# Patient Record
Sex: Male | Born: 1970
Health system: Southern US, Community
[De-identification: ages and names within clinical notes are randomized; demographics above are authoritative.]

## PROBLEM LIST (undated history)

## (undated) DIAGNOSIS — G4733 Obstructive sleep apnea (adult) (pediatric): Secondary | ICD-10-CM

## (undated) DIAGNOSIS — I2699 Other pulmonary embolism without acute cor pulmonale: Secondary | ICD-10-CM

## (undated) DIAGNOSIS — R7989 Other specified abnormal findings of blood chemistry: Secondary | ICD-10-CM

## (undated) HISTORY — DX: Obstructive sleep apnea (adult) (pediatric): G47.33

## (undated) HISTORY — DX: Other pulmonary embolism without acute cor pulmonale: I26.99

## (undated) HISTORY — DX: Other specified abnormal findings of blood chemistry: R79.89

---

## 1999-03-19 ENCOUNTER — Encounter: Payer: Self-pay | Admitting: Specialist

## 1999-03-19 ENCOUNTER — Encounter: Admission: RE | Admit: 1999-03-19 | Discharge: 1999-03-19 | Payer: Self-pay | Admitting: Specialist

## 1999-03-27 ENCOUNTER — Encounter: Admission: RE | Admit: 1999-03-27 | Discharge: 1999-04-03 | Payer: Self-pay | Admitting: Specialist

## 2003-08-22 ENCOUNTER — Ambulatory Visit (HOSPITAL_COMMUNITY): Admission: RE | Admit: 2003-08-22 | Discharge: 2003-08-22 | Payer: Self-pay | Admitting: Unknown Physician Specialty

## 2003-12-18 ENCOUNTER — Ambulatory Visit: Payer: Self-pay | Admitting: Family Medicine

## 2006-03-30 ENCOUNTER — Ambulatory Visit: Payer: Self-pay | Admitting: Family Medicine

## 2012-04-08 ENCOUNTER — Ambulatory Visit (INDEPENDENT_AMBULATORY_CARE_PROVIDER_SITE_OTHER): Payer: BC Managed Care – PPO | Admitting: Nurse Practitioner

## 2012-04-08 ENCOUNTER — Encounter: Payer: Self-pay | Admitting: Nurse Practitioner

## 2012-04-08 VITALS — BP 113/75 | HR 67 | Temp 97.2°F | Ht 72.0 in | Wt 244.0 lb

## 2012-04-08 DIAGNOSIS — Z0000 Encounter for general adult medical examination without abnormal findings: Secondary | ICD-10-CM

## 2012-04-08 LAB — COMPLETE METABOLIC PANEL WITH GFR
ALT: 18 U/L (ref 0–53)
AST: 15 U/L (ref 0–37)
Albumin: 4.7 g/dL (ref 3.5–5.2)
Alkaline Phosphatase: 73 U/L (ref 39–117)
BUN: 14 mg/dL (ref 6–23)
CO2: 28 mEq/L (ref 19–32)
Calcium: 9.9 mg/dL (ref 8.4–10.5)
Chloride: 103 mEq/L (ref 96–112)
Creat: 1.28 mg/dL (ref 0.50–1.35)
GFR, Est African American: 79 mL/min
GFR, Est Non African American: 69 mL/min
Glucose, Bld: 87 mg/dL (ref 70–99)
Potassium: 4.8 mEq/L (ref 3.5–5.3)
Sodium: 139 mEq/L (ref 135–145)
Total Bilirubin: 0.9 mg/dL (ref 0.3–1.2)
Total Protein: 7.2 g/dL (ref 6.0–8.3)

## 2012-04-08 LAB — POCT CBC
Granulocyte percent: 61.3 %G (ref 37–80)
HCT, POC: 45.53 % (ref 43.5–53.7)
Hemoglobin: 15.3 g/dL (ref 14.1–18.1)
Lymph, poc: 1.9 (ref 0.6–3.4)
MCH, POC: 29.8 pg (ref 27–31.2)
MCHC: 33.6 g/dL (ref 31.8–35.4)
MCV: 88.6 fL (ref 80–97)
MPV: 8.5 fL (ref 0–99.8)
POC Granulocyte: 3.6 (ref 2–6.9)
POC LYMPH PERCENT: 32 %L (ref 10–50)
Platelet Count, POC: 209 10*3/uL (ref 142–424)
RBC: 5.1 M/uL (ref 4.69–6.13)
RDW, POC: 13.5 %
WBC: 5.9 10*3/uL (ref 4.6–10.2)

## 2012-04-08 NOTE — Progress Notes (Signed)
  Subjective:    Patient ID: Shawn [name], male    DOB: [birth year], 42 y.o.   MRN: [id number]  HPI Pt here for complete physical with no complaints at this time    Review of Systems  All other systems reviewed and are negative.       Objective:   Physical Exam  Constitutional: He is oriented to person, place, and time. He appears well-developed and well-nourished.  HENT:  Head: Normocephalic.  Right Ear: External ear normal.  Left Ear: External ear normal.  Nose: Nose normal.  Mouth/Throat: Oropharynx is clear and moist.  Eyes: EOM are normal. Pupils are equal, round, and reactive to light.  Neck: Normal range of motion. Neck supple. No thyromegaly present.  Cardiovascular: Normal rate, regular rhythm, normal heart sounds and intact distal pulses.   No murmur heard. Pulmonary/Chest: Effort normal and breath sounds normal. He has no wheezes. He has no rales.  Abdominal: Soft. Bowel sounds are normal.  Genitourinary: Prostate normal and penis normal.  Musculoskeletal: Normal range of motion.  Neurological: He is alert and oriented to person, place, and time.  Skin: Skin is warm and dry.  Psychiatric: He has a normal mood and affect. His behavior is normal. Judgment and thought content normal.   BP 113/75  Pulse 67  Temp(Src) 97.2 F (36.2 C) (Oral)  Ht 6' (1.829 m)  Wt 244 lb (110.678 kg)  BMI 33.09 kg/m2        Assessment & Plan:  CPE  Labs pending  Low fat diet and exercise  Health Maintenance  [name], FNP

## 2012-04-08 NOTE — Patient Instructions (Signed)
Health Maintenance, Males A healthy lifestyle and preventative care can promote health and wellness.  Maintain regular health, dental, and eye exams.  Eat a healthy diet. Foods like vegetables, fruits, whole grains, low-fat dairy products, and lean protein foods contain the nutrients you need without too many calories. Decrease your intake of foods high in solid fats, added sugars, and salt. Get information about a proper diet from your caregiver, if necessary.  Regular physical exercise is one of the most important things you can do for your health. Most adults should get at least 150 minutes of moderate-intensity exercise (any activity that increases your heart rate and causes you to sweat) each week. In addition, most adults need muscle-strengthening exercises on 2 or more days a week.   Maintain a healthy weight. The body mass index (BMI) is a screening tool to identify possible weight problems. It provides an estimate of body fat based on height and weight. Your caregiver can help determine your BMI, and can help you achieve or maintain a healthy weight. For adults 20 years and older:  A BMI below 18.5 is considered underweight.  A BMI of 18.5 to 24.9 is normal.  A BMI of 25 to 29.9 is considered overweight.  A BMI of 30 and above is considered obese.  Maintain normal blood lipids and cholesterol by exercising and minimizing your intake of saturated fat. Eat a balanced diet with plenty of fruits and vegetables. Blood tests for lipids and cholesterol should begin at age 20 and be repeated every 5 years. If your lipid or cholesterol levels are high, you are over 50, or you are a high risk for heart disease, you may need your cholesterol levels checked more frequently.Ongoing high lipid and cholesterol levels should be treated with medicines, if diet and exercise are not effective.  If you smoke, find out from your caregiver how to quit. If you do not use tobacco, do not start.  If you  choose to drink alcohol, do not exceed 2 drinks per day. One drink is considered to be 12 ounces (355 mL) of beer, 5 ounces (148 mL) of wine, or 1.5 ounces (44 mL) of liquor.  Avoid use of street drugs. Do not share needles with anyone. Ask for help if you need support or instructions about stopping the use of drugs.  High blood pressure causes heart disease and increases the risk of stroke. Blood pressure should be checked at least every 1 to 2 years. Ongoing high blood pressure should be treated with medicines if weight loss and exercise are not effective.  If you are 45 to 42 years old, ask your caregiver if you should take aspirin to prevent heart disease.  Diabetes screening involves taking a blood sample to check your fasting blood sugar level. This should be done once every 3 years, after age 45, if you are within normal weight and without risk factors for diabetes. Testing should be considered at a younger age or be carried out more frequently if you are overweight and have at least 1 risk factor for diabetes.  Colorectal cancer can be detected and often prevented. Most routine colorectal cancer screening begins at the age of 50 and continues through age 75. However, your caregiver may recommend screening at an earlier age if you have risk factors for colon cancer. On a yearly basis, your caregiver may provide home test kits to check for hidden blood in the stool. Use of a small camera at the end of a tube,   to directly examine the colon (sigmoidoscopy or colonoscopy), can detect the earliest forms of colorectal cancer. Talk to your caregiver about this at age 50, when routine screening begins. Direct examination of the colon should be repeated every 5 to 10 years through age 75, unless early forms of pre-cancerous polyps or small growths are found.  Hepatitis C blood testing is recommended for all people born from 1945 through 1965 and any individual with known risks for hepatitis C.  Healthy  men should no longer receive prostate-specific antigen (PSA) blood tests as part of routine cancer screening. Consult with your caregiver about prostate cancer screening.  Testicular cancer screening is not recommended for adolescents or adult males who have no symptoms. Screening includes self-exam, caregiver exam, and other screening tests. Consult with your caregiver about any symptoms you have or any concerns you have about testicular cancer.  Practice safe sex. Use condoms and avoid high-risk sexual practices to reduce the spread of sexually transmitted infections (STIs).  Use sunscreen with a sun protection factor (SPF) of 30 or greater. Apply sunscreen liberally and repeatedly throughout the day. You should seek shade when your shadow is shorter than you. Protect yourself by wearing long sleeves, pants, a wide-brimmed hat, and sunglasses year round, whenever you are outdoors.  Notify your caregiver of new moles or changes in moles, especially if there is a change in shape or color. Also notify your caregiver if a mole is larger than the size of a pencil eraser.  A one-time screening for abdominal aortic aneurysm (AAA) and surgical repair of large AAAs by sound wave imaging (ultrasonography) is recommended for ages 65 to 75 years who are current or former smokers.  Stay current with your immunizations. Document Released: 06/20/2007 Document Revised: 03/16/2011 Document Reviewed: 05/19/2010 [employer] Patient Information 2013 [employer].  

## 2012-04-09 LAB — PSA: PSA: 0.94 ng/mL (ref <=4.00)

## 2012-04-11 LAB — NMR LIPOPROFILE WITH LIPIDS
Cholesterol, Total: 202 mg/dL — ABNORMAL HIGH (ref <200)
HDL Particle Number: 34.1 umol/L (ref >=30.5)
HDL Size: 8.8 nm — ABNORMAL LOW (ref >=9.2)
HDL-C: 53 mg/dL (ref >=40)
LDL (calc): 136 mg/dL — ABNORMAL HIGH (ref <100)
LDL Particle Number: 1400 nmol/L — ABNORMAL HIGH (ref <1000)
LDL Size: 21.1 nm (ref >20.5)
LP-IR Score: 31 (ref <=45)
Large HDL-P: 4.5 umol/L — ABNORMAL LOW (ref >=4.8)
Large VLDL-P: 1 nmol/L (ref <=2.7)
Small LDL Particle Number: 623 nmol/L — ABNORMAL HIGH (ref <=527)
Triglycerides: 64 mg/dL (ref <150)
VLDL Size: 38 nm (ref <=46.6)

## 2013-03-06 ENCOUNTER — Telehealth: Payer: Self-pay | Admitting: Nurse Practitioner

## 2013-03-06 NOTE — Telephone Encounter (Signed)
appt tomorrow with Bill 

## 2013-03-07 ENCOUNTER — Ambulatory Visit (INDEPENDENT_AMBULATORY_CARE_PROVIDER_SITE_OTHER): Payer: BC Managed Care – PPO

## 2013-03-07 ENCOUNTER — Encounter: Payer: Self-pay | Admitting: Family Medicine

## 2013-03-07 ENCOUNTER — Ambulatory Visit (INDEPENDENT_AMBULATORY_CARE_PROVIDER_SITE_OTHER): Payer: BC Managed Care – PPO | Admitting: Family Medicine

## 2013-03-07 VITALS — BP 121/77 | HR 91 | Temp 97.5°F | Ht 72.0 in | Wt 246.6 lb

## 2013-03-07 DIAGNOSIS — R0602 Shortness of breath: Secondary | ICD-10-CM

## 2013-03-07 DIAGNOSIS — E785 Hyperlipidemia, unspecified: Secondary | ICD-10-CM

## 2013-03-07 MED ORDER — LEVOFLOXACIN 500 MG PO TABS: 500.0000 mg | ORAL_TABLET | Freq: Every day | ORAL | Status: DC

## 2013-03-07 MED ORDER — METHYLPREDNISOLONE (PAK) 4 MG PO TABS: ORAL_TABLET | ORAL | Status: DC

## 2013-03-07 MED ORDER — HYDROCODONE-HOMATROPINE 5-1.5 MG/5ML PO SYRP: 5.0000 mL | ORAL_SOLUTION | Freq: Three times a day (TID) | ORAL | Status: DC | PRN

## 2013-03-07 NOTE — Progress Notes (Signed)
   Subjective:    Patient ID: Shawn [name], male    DOB: 02/25/1970, 43 y.o.   MRN: [id number]  HPI  This 43 y.o. male presents for evaluation of cough and bronchitis sx's.  Review of Systems No chest pain, SOB, HA, dizziness, vision change, N/V, diarrhea, constipation, dysuria, urinary urgency or frequency, myalgias, arthralgias or rash.    Objective:   Physical Exam   Vital signs noted  Well developed well nourished male.  HEENT - Head atraumatic Normocephalic                Eyes - PERRLA, Conjuctiva - clear Sclera- Clear EOMI                Ears - EAC's Wnl TM's Wnl Gross Hearing WNL                Nose - Nares patent                 Throat - oropharanx wnl Respiratory - Lungs CTA bilateral Cardiac - RRR S1 and S2 without murmur GI - Abdomen soft Nontender and bowel sounds active x 4 Extremities - No edema. Neuro - Grossly intact.    CXR - No infiltrates Prelimnary reading by Angeline SlimWilliam Herberto Ledwell,FNP Assessment & Plan:  SOB (shortness of breath) - Plan: DG Chest 2 View  Bronchitis - Levaquin 500mg  one po bid x 10 days, medrol dose pack as directed, Hycodan cough syrup.  Push po fluids, rest, tylenol and motrin otc prn as directed for fever, arthralgias, and myalgias.  Follow up prn if sx's continue or persist.  Deatra CanterWilliam J Mry Lamia FNP

## 2013-03-08 LAB — LIPID PANEL
Chol/HDL Ratio: 4 ratio units (ref 0.0–5.0)
Cholesterol, Total: 158 mg/dL (ref 100–199)
HDL: 40 mg/dL (ref >39)
LDL Calculated: 99 mg/dL (ref 0–99)
Triglycerides: 96 mg/dL (ref 0–149)
VLDL Cholesterol Cal: 19 mg/dL (ref 5–40)

## 2013-06-07 ENCOUNTER — Ambulatory Visit (INDEPENDENT_AMBULATORY_CARE_PROVIDER_SITE_OTHER): Payer: BC Managed Care – PPO | Admitting: Family Medicine

## 2013-06-07 ENCOUNTER — Encounter: Payer: Self-pay | Admitting: Family Medicine

## 2013-06-07 VITALS — BP 121/83 | HR 62 | Temp 98.2°F | Ht 72.0 in | Wt 245.0 lb

## 2013-06-07 DIAGNOSIS — R1012 Left upper quadrant pain: Secondary | ICD-10-CM

## 2013-06-07 DIAGNOSIS — M549 Dorsalgia, unspecified: Secondary | ICD-10-CM

## 2013-06-07 DIAGNOSIS — K5900 Constipation, unspecified: Secondary | ICD-10-CM

## 2013-06-07 DIAGNOSIS — R3915 Urgency of urination: Secondary | ICD-10-CM

## 2013-06-07 LAB — POCT URINALYSIS DIPSTICK
Bilirubin, UA: NEGATIVE
Blood, UA: NEGATIVE
Glucose, UA: NEGATIVE
KETONES UA: NEGATIVE
Leukocytes, UA: NEGATIVE
Nitrite, UA: NEGATIVE
PH UA: 5
SPEC GRAV UA: 1.015
UROBILINOGEN UA: NEGATIVE

## 2013-06-07 LAB — POCT CBC
GRANULOCYTE PERCENT: 72.7 % (ref 37–80)
HEMATOCRIT: 49.5 % (ref 43.5–53.7)
Hemoglobin: 16 g/dL (ref 14.1–18.1)
Lymph, poc: 1.6 (ref 0.6–3.4)
MCH, POC: 29.1 pg (ref 27–31.2)
MCHC: 32.3 g/dL (ref 31.8–35.4)
MCV: 90.2 fL (ref 80–97)
MPV: 8.6 fL (ref 0–99.8)
POC GRANULOCYTE: 4.9 (ref 2–6.9)
POC LYMPH PERCENT: 23.3 %L (ref 10–50)
Platelet Count, POC: 189 10*3/uL (ref 142–424)
RBC: 5.5 M/uL (ref 4.69–6.13)
RDW, POC: 13.2 %
WBC: 6.7 10*3/uL (ref 4.6–10.2)

## 2013-06-07 LAB — POCT UA - MICROSCOPIC ONLY
BACTERIA, U MICROSCOPIC: NEGATIVE
CRYSTALS, UR, HPF, POC: NEGATIVE
Casts, Ur, LPF, POC: NEGATIVE
Epithelial cells, urine per micros: NEGATIVE
MUCUS UA: NEGATIVE
RBC, urine, microscopic: NEGATIVE
WBC, Ur, HPF, POC: NEGATIVE
Yeast, UA: NEGATIVE

## 2013-06-07 MED ORDER — LINACLOTIDE 145 MCG PO CAPS: ORAL_CAPSULE | ORAL | Status: DC

## 2013-06-07 NOTE — Patient Instructions (Addendum)
Constipation, Adult Constipation is when a person has fewer than 3 bowel movements a week; has difficulty having a bowel movement; or has stools that are dry, hard, or larger than normal. As people grow older, constipation is more common. If you try to fix constipation with medicines that make you have a bowel movement (laxatives), the problem may get worse. Long-term laxative use may cause the muscles of the colon to become weak. A low-fiber diet, not taking in enough fluids, and taking certain medicines may make constipation worse. CAUSES   Certain medicines, such as antidepressants, pain medicine, iron supplements, antacids, and water pills.   Certain diseases, such as diabetes, irritable bowel syndrome (IBS), thyroid disease, or depression.   Not drinking enough water.   Not eating enough fiber-rich foods.   Stress or travel.  Lack of physical activity or exercise.  Not going to the restroom when there is the urge to have a bowel movement.  Ignoring the urge to have a bowel movement.  Using laxatives too much. SYMPTOMS   Having fewer than 3 bowel movements a week.   Straining to have a bowel movement.   Having hard, dry, or larger than normal stools.   Feeling full or bloated.   Pain in the lower abdomen.  Not feeling relief after having a bowel movement. DIAGNOSIS  Your caregiver will take a medical history and perform a physical exam. Further testing may be done for severe constipation. Some tests may include:   A barium enema X-ray to examine your rectum, colon, and sometimes, your small intestine.  A sigmoidoscopy to examine your lower colon.  A colonoscopy to examine your entire colon. TREATMENT  Treatment will depend on the severity of your constipation and what is causing it. Some dietary treatments include drinking more fluids and eating more fiber-rich foods. Lifestyle treatments may include regular exercise. If these diet and lifestyle  recommendations do not help, your caregiver may recommend taking over-the-counter laxative medicines to help you have bowel movements. Prescription medicines may be prescribed if over-the-counter medicines do not work.  HOME CARE INSTRUCTIONS   Increase dietary fiber in your diet, such as fruits, vegetables, whole grains, and beans. Limit high-fat and processed sugars in your diet, such as [place] fries, hamburgers, cookies, candies, and soda.   A fiber supplement may be added to your diet if you cannot get enough fiber from foods.   Drink enough fluids to keep your urine clear or pale yellow.   Exercise regularly or as directed by your caregiver.   Go to the restroom when you have the urge to go. Do not hold it.  Only take medicines as directed by your caregiver. Do not take other medicines for constipation without talking to your caregiver first. SEEK IMMEDIATE MEDICAL CARE IF:   You have bright red blood in your stool.   Your constipation lasts for more than 4 days or gets worse.   You have abdominal or rectal pain.   You have thin, pencil-like stools.  You have unexplained weight loss. MAKE SURE YOU:   Understand these instructions.  Will watch your condition.  Will get help right away if you are not doing well or get worse. Document Released: 09/20/2003 Document Revised: 03/16/2011 Document Reviewed: 10/03/2012 [facility] Patient Information 2014 [place], [place].   To get a CT scan of the abdomen and pelvis  take prescribed medication as directed Continue to drink plenty of water and fluids Return the FOBT We will  call you with the lab work as soon as the results are available He may continue the stool softener you're currently taking in addition to the medication we prescribed for constipation

## 2013-06-07 NOTE — Addendum Note (Signed)
Addended by: Magdalene River on: 06/07/2013 03:19 PM   Modules accepted: Orders

## 2013-06-07 NOTE — Progress Notes (Signed)
Subjective:    Patient ID: Shawn [name], male    DOB: 12-Dec-1970, 43 y.o.   MRN: [id number]  HPI Patient here today for constipation issues. The patient says this has been going on for years. His constipation is worse he has increased back pain. For about a year he has had increased urinary urgency. His last good bowel movement was one week ago. He is not taking any medication and indicates he drinks lots of water and fluids and eats what he thinks is a very healthy diet with fruits and vegetables. He is taking some stool softener and had a slight small bowel movement this morning. The patient indicates that his father has had the very same problem. There is no family history of colon cancer. The patient has not seen any blood or bright in his stool.        There are no active problems to display for this patient.  Outpatient Encounter Prescriptions as of 06/07/2013  Medication Sig  . [DISCONTINUED] HYDROcodone-homatropine (HYCODAN) 5-1.5 MG/5ML syrup Take 5 mLs by mouth every 8 (eight) hours as needed for cough.  . [DISCONTINUED] levofloxacin (LEVAQUIN) 500 MG tablet Take 1 tablet (500 mg total) by mouth daily.  . [DISCONTINUED] methylPREDNIsolone (MEDROL DOSPACK) 4 MG tablet follow package directions    Review of Systems  Constitutional: Negative.   HENT: Negative.   Eyes: Negative.   Respiratory: Negative.   Cardiovascular: Negative.   Gastrointestinal: Positive for constipation.  Endocrine: Negative.   Genitourinary: Negative.   Musculoskeletal: Positive for back pain.  Skin: Negative.   Allergic/Immunologic: Negative.   Neurological: Negative.   Hematological: Negative.   Psychiatric/Behavioral: Negative.        Objective:   Physical Exam  Nursing note and vitals reviewed. Constitutional: He is oriented to person, place, and time. He appears well-developed and well-nourished. No distress.  HENT:  Head: Normocephalic and atraumatic.  Right Ear: External ear normal.    Left Ear: External ear normal.  Mouth/Throat: Oropharynx is clear and moist. No oropharyngeal exudate.  Nasal congestion  Eyes: Conjunctivae and EOM are normal. Pupils are equal, round, and reactive to light. Right eye exhibits no discharge. Left eye exhibits no discharge. No scleral icterus.  Neck: Normal range of motion. Neck supple. No thyromegaly present.  Cardiovascular: Normal rate, regular rhythm, normal heart sounds and intact distal pulses.  Exam reveals no gallop and no friction rub.   No murmur heard. Pulmonary/Chest: Effort normal and breath sounds normal. No respiratory distress. He has no wheezes. He has no rales. He exhibits no tenderness.  Abdominal: Soft. Bowel sounds are normal. He exhibits no mass. There is tenderness. There is no rebound and no guarding.  Left Upper quadrant and left side of abdomen  Genitourinary: Rectum normal and penis normal.  The prostate was enlarged. There were no rectal masses. There was no stool palpable in the rectum. The external genitalia were normal  Musculoskeletal: Normal range of motion. He exhibits no edema and no tenderness.  Lymphadenopathy:    He has no cervical adenopathy.  Neurological: He is alert and oriented to person, place, and time. He has normal reflexes.  Skin: Skin is warm and dry. No rash noted. No erythema. No pallor.  Psychiatric: He has a normal mood and affect. His behavior is normal. Judgment and thought content normal.   BP 121/83  Pulse 62  Temp(Src) 98.2 F (36.8 C) (Oral)  Ht 6' (1.829 m)  Wt 245 lb (111.131 kg)  BMI 33.22 kg/m2  Assessment & Plan:  1. Urinary urgency - POCT UA - Microscopic Only - POCT urinalysis dipstick - POCT CBC - BMP8+EGFR - Hepatic function panel - Thyroid Panel With TSH  2. Constipation - POCT CBC - BMP8+EGFR - Hepatic function panel - Thyroid Panel With TSH - Linaclotide (LINZESS) 145 MCG CAPS capsule; 1 capsule daily one half hour before the first meal in the  morning  Dispense: 30 capsule; Refill: 1  3. Abdominal pain, left upper quadrant - Sedimentation rate - Linaclotide (LINZESS) 145 MCG CAPS capsule; 1 capsule daily one half hour before the first meal in the morning  Dispense: 30 capsule; Refill: 1 - CT Abdomen Pelvis Wo Contrast; Future  4. Back pain - Sedimentation rate - CT Abdomen Pelvis Wo Contrast; Future  Patient Instructions        Constipation, Adult Constipation is when a person has fewer than 3 bowel movements a week; has difficulty having a bowel movement; or has stools that are dry, hard, or larger than normal. As people grow older, constipation is more common. If you try to fix constipation with medicines that make you have a bowel movement (laxatives), the problem may get worse. Long-term laxative use may cause the muscles of the colon to become weak. A low-fiber diet, not taking in enough fluids, and taking certain medicines may make constipation worse. CAUSES   Certain medicines, such as antidepressants, pain medicine, iron supplements, antacids, and water pills.   Certain diseases, such as diabetes, irritable bowel syndrome (IBS), thyroid disease, or depression.   Not drinking enough water.   Not eating enough fiber-rich foods.   Stress or travel.  Lack of physical activity or exercise.  Not going to the restroom when there is the urge to have a bowel movement.  Ignoring the urge to have a bowel movement.  Using laxatives too much. SYMPTOMS   Having fewer than 3 bowel movements a week.   Straining to have a bowel movement.   Having hard, dry, or larger than normal stools.   Feeling full or bloated.   Pain in the lower abdomen.  Not feeling relief after having a bowel movement. DIAGNOSIS  Your caregiver will take a medical history and perform a physical exam. Further testing may be done for severe constipation. Some tests may include:   A barium enema X-ray to examine your rectum, colon,  and sometimes, your small intestine.  A sigmoidoscopy to examine your lower colon.  A colonoscopy to examine your entire colon. TREATMENT  Treatment will depend on the severity of your constipation and what is causing it. Some dietary treatments include drinking more fluids and eating more fiber-rich foods. Lifestyle treatments may include regular exercise. If these diet and lifestyle recommendations do not help, your caregiver may recommend taking over-the-counter laxative medicines to help you have bowel movements. Prescription medicines may be prescribed if over-the-counter medicines do not work.  HOME CARE INSTRUCTIONS   Increase dietary fiber in your diet, such as fruits, vegetables, whole grains, and beans. Limit high-fat and processed sugars in your diet, such as [place] fries, hamburgers, cookies, candies, and soda.   A fiber supplement may be added to your diet if you cannot get enough fiber from foods.   Drink enough fluids to keep your urine clear or pale yellow.   Exercise regularly or as directed by your caregiver.   Go to the restroom when you have the urge to go. Do not hold it.  Only take medicines as directed by your  caregiver. Do not take other medicines for constipation without talking to your caregiver first. [place] IF:   You have bright red blood in your stool.   Your constipation lasts for more than 4 days or gets worse.   You have abdominal or rectal pain.   You have thin, pencil-like stools.  You have unexplained weight loss. MAKE SURE YOU:   Understand these instructions.  Will watch your condition.  Will get help right away if you are not doing well or get worse. Document Released: 09/20/2003 Document Revised: 03/16/2011 Document Reviewed: 10/03/2012 [facility] Patient Information 2014 [place], [place].   To get a CT scan of the abdomen and pelvis  take prescribed medication as directed Continue to drink plenty of water and  fluids Return the FOBT We will call you with the lab work as soon as the results are available    Arrie Senate MD

## 2013-06-08 LAB — THYROID PANEL WITH TSH
Free Thyroxine Index: 1.8 (ref 1.2–4.9)
T3 Uptake Ratio: 29 % (ref 24–39)
T4, Total: 6.1 ug/dL (ref 4.5–12.0)
TSH: 1.44 u[IU]/mL (ref 0.450–4.500)

## 2013-06-08 LAB — BMP8+EGFR
BUN / CREAT RATIO: 13 (ref 9–20)
BUN: 17 mg/dL (ref 6–24)
CO2: 27 mmol/L (ref 18–29)
CREATININE: 1.3 mg/dL — AB (ref 0.76–1.27)
Calcium: 9.6 mg/dL (ref 8.7–10.2)
Chloride: 103 mmol/L (ref 97–108)
GFR calc non Af Amer: 67 mL/min/{1.73_m2} (ref >59)
GFR, EST AFRICAN AMERICAN: 77 mL/min/{1.73_m2} (ref >59)
GLUCOSE: 93 mg/dL (ref 65–99)
Potassium: 5.4 mmol/L — ABNORMAL HIGH (ref 3.5–5.2)
Sodium: 142 mmol/L (ref 134–144)

## 2013-06-08 LAB — HEPATIC FUNCTION PANEL
ALT: 35 IU/L (ref 0–44)
AST: 25 IU/L (ref 0–40)
Albumin: 4.6 g/dL (ref 3.5–5.5)
Alkaline Phosphatase: 82 IU/L (ref 39–117)
BILIRUBIN TOTAL: 0.4 mg/dL (ref 0.0–1.2)
Bilirubin, Direct: 0.13 mg/dL (ref 0.00–0.40)
TOTAL PROTEIN: 6.8 g/dL (ref 6.0–8.5)

## 2013-06-08 LAB — SEDIMENTATION RATE: Sed Rate: 2 mm/hr (ref 0–15)

## 2013-06-09 ENCOUNTER — Telehealth: Payer: Self-pay

## 2013-06-09 ENCOUNTER — Encounter (HOSPITAL_COMMUNITY): Payer: Self-pay

## 2013-06-09 ENCOUNTER — Ambulatory Visit (HOSPITAL_COMMUNITY)
Admission: RE | Admit: 2013-06-09 | Discharge: 2013-06-09 | Disposition: A | Discharge status: Home / Self Care | Payer: BC Managed Care – PPO | Source: Ambulatory Visit | Attending: Family Medicine | Admitting: Family Medicine

## 2013-06-09 DIAGNOSIS — R918 Other nonspecific abnormal finding of lung field: Secondary | ICD-10-CM | POA: Insufficient documentation

## 2013-06-09 DIAGNOSIS — M47817 Spondylosis without myelopathy or radiculopathy, lumbosacral region: Secondary | ICD-10-CM | POA: Insufficient documentation

## 2013-06-09 DIAGNOSIS — R109 Unspecified abdominal pain: Secondary | ICD-10-CM | POA: Insufficient documentation

## 2013-06-09 DIAGNOSIS — K5900 Constipation, unspecified: Secondary | ICD-10-CM

## 2013-06-09 DIAGNOSIS — M549 Dorsalgia, unspecified: Secondary | ICD-10-CM | POA: Insufficient documentation

## 2013-06-09 DIAGNOSIS — R1012 Left upper quadrant pain: Secondary | ICD-10-CM

## 2013-06-09 NOTE — Telephone Encounter (Signed)
Pt aware of CT results. 

## 2013-06-09 NOTE — Telephone Encounter (Signed)
Message copied by Roselee Culver on Fri Jun 09, 2013 12:51 PM ------      Message from: Ernestina Penna      Created: Fri Jun 09, 2013 10:35 AM       As per radiology report----please let patient know that the abdomen and pelvis were normal without any kind of masses. Also let him know that he has spondylosis and his thorax and lumbar spine and this is like wearing tear or arthritic inflammation. Also let him know there was a 3 mm nodule in the left lung base and I would go along and schedule him for a repeat CT scan in one year to followup on this.      Please make sure that the patient has a followup appointment for me to evaluate his constipation issues and also make sure that he has an FOBT that he should return++++ ------

## 2013-06-10 NOTE — Telephone Encounter (Signed)
Please go ahead and make a referral to the gastroenterologist for further evaluation and continue medication that was prescribed in this office

## 2013-06-12 NOTE — Telephone Encounter (Signed)
Patient aware and wants to go some where in [place]

## 2013-06-13 ENCOUNTER — Other Ambulatory Visit: Payer: Self-pay | Admitting: *Deleted

## 2013-06-13 DIAGNOSIS — K5900 Constipation, unspecified: Secondary | ICD-10-CM

## 2013-06-13 NOTE — Telephone Encounter (Signed)
Bill put in referral

## 2013-06-13 NOTE — Telephone Encounter (Signed)
I cant make referrals. Can you please put this in?

## 2013-06-22 ENCOUNTER — Ambulatory Visit: Payer: BC Managed Care – PPO | Admitting: Family Medicine

## 2013-09-07 ENCOUNTER — Other Ambulatory Visit: Payer: Self-pay | Admitting: Family Medicine

## 2014-06-18 ENCOUNTER — Other Ambulatory Visit: Payer: Self-pay | Admitting: *Deleted

## 2014-06-18 DIAGNOSIS — R911 Solitary pulmonary nodule: Secondary | ICD-10-CM

## 2014-06-28 ENCOUNTER — Telehealth: Payer: Self-pay | Admitting: Family Medicine

## 2014-06-28 ENCOUNTER — Ambulatory Visit (HOSPITAL_COMMUNITY)
Admission: RE | Admit: 2014-06-28 | Discharge: 2014-06-28 | Disposition: A | Discharge status: Home / Self Care | Payer: BLUE CROSS/BLUE SHIELD | Source: Ambulatory Visit | Attending: Family Medicine | Admitting: Family Medicine

## 2014-06-28 DIAGNOSIS — R911 Solitary pulmonary nodule: Secondary | ICD-10-CM | POA: Diagnosis present

## 2015-02-27 ENCOUNTER — Encounter: Payer: Self-pay | Admitting: Pediatrics

## 2015-02-27 ENCOUNTER — Ambulatory Visit: Payer: Self-pay | Admitting: Pediatrics

## 2015-02-27 ENCOUNTER — Ambulatory Visit (INDEPENDENT_AMBULATORY_CARE_PROVIDER_SITE_OTHER): Payer: BLUE CROSS/BLUE SHIELD | Admitting: Pediatrics

## 2015-02-27 ENCOUNTER — Telehealth: Payer: Self-pay | Admitting: Pediatrics

## 2015-02-27 VITALS — BP 115/73 | HR 85 | Temp 97.5°F | Ht 72.0 in | Wt 256.8 lb

## 2015-02-27 DIAGNOSIS — R6889 Other general symptoms and signs: Secondary | ICD-10-CM

## 2015-02-27 DIAGNOSIS — R062 Wheezing: Secondary | ICD-10-CM

## 2015-02-27 LAB — POCT INFLUENZA A/B
Influenza A, POC: NEGATIVE
Influenza B, POC: NEGATIVE

## 2015-02-27 MED ORDER — AZITHROMYCIN 250 MG PO TABS: ORAL_TABLET | ORAL | Status: DC

## 2015-02-27 MED ORDER — ALBUTEROL SULFATE HFA 108 (90 BASE) MCG/ACT IN AERS: 2.0000 | INHALATION_SPRAY | Freq: Four times a day (QID) | RESPIRATORY_TRACT | Status: DC | PRN

## 2015-02-27 MED ORDER — SPACER/AERO CHAMBER MOUTHPIECE MISC: Status: DC

## 2015-02-27 NOTE — Telephone Encounter (Signed)
Letter written and has been given to pt's mother.

## 2015-02-27 NOTE — Progress Notes (Signed)
Subjective:    Patient ID: Shawn [name], male    DOB: Mar 06, 1970, 45 y.o.   MRN: [id number]  CC: Nasal Congestion; Fever; Generalized Body Aches; and Cough   HPI: Edie Darley is a 45 y.o. male presenting for Nasal Congestion; Fever; Generalized Body Aches; and Cough  Started getting sick 4 days ago started coughing, still coughing things up Sweats and chills at home Taking OTC meds, not sure what they are Not sure if taking anything  Cough keeping him awake at night Coughing up a lot H/o smoking Cutting back, now down to a couple cigarettes a day   Depression screen [facility] 2/9 03/07/2013  Decreased Interest 0  Down, Depressed, Hopeless 0  PHQ - 2 Score 0     Relevant past medical, surgical, family and social history reviewed and updated as indicated. Interim medical history since our last visit reviewed. Allergies and medications reviewed and updated.    ROS: Per HPI unless specifically indicated above  History  Smoking status  . Current Every Day Smoker -- 0.25 packs/day for 15 years  Smokeless tobacco  . Not on file    Past Medical History There are no active problems to display for this patient.   Current Outpatient Prescriptions  Medication Sig Dispense Refill  . albuterol (PROVENTIL HFA;VENTOLIN HFA) 108 (90 Base) MCG/ACT inhaler Inhale 2 puffs into the lungs every 6 (six) hours as needed for wheezing or shortness of breath. 1 Inhaler 0  . azithromycin (ZITHROMAX) 250 MG tablet Take 2 the first day and then one each day after. 6 tablet 0  . Spacer/Aero Chamber Mouthpiece MISC Please dispense one spacer for use with inhaler. 1 each 0   No current facility-administered medications for this visit.       Objective:    BP 115/73 mmHg  Pulse 85  Temp(Src) 97.5 F (36.4 C) (Oral)  Ht 6' (1.829 m)  Wt 256 lb 12.8 oz (116.484 kg)  BMI 34.82 kg/m2  Wt Readings from Last 3 Encounters:  02/27/15 256 lb 12.8 oz (116.484 kg)  06/07/13 245 lb (111.131 kg)    03/07/13 246 lb 9.6 oz (111.857 kg)      Gen: NAD, alert, cooperative with exam, NCAT, congested EYES: EOMI, no scleral injection or icterus ENT:  TMs pearly gray b/l, slightly splayed LR, OP without erythema LYMPH: no cervical LAD CV: NRRR, normal S1/S2, no murmur, distal pulses 2+ b/l Resp: exp wheeze heard anteriorly and posterior. Moving air fair. normal WOB, talking in complete sentences. Abd: +BS, soft, NTND.  Ext: No edema, warm Neuro: Alert and oriented     Assessment & Plan:    Quavon was seen today for nasal congestion, fever, generalized body aches and cough, flu test negative, likely due to acute URI, symptoms ongoing 4 days. Now with wheezing, increased sputum production, smoking history. No dx of COPD, has never had PFTs. Will treat with albuterol and azithro, discussed sx care for URI.   Diagnoses and all orders for this visit:  Flu-like symptoms -     POCT Influenza A/B  Wheezing -     albuterol (PROVENTIL HFA;VENTOLIN HFA) 108 (90 Base) MCG/ACT inhaler; Inhale 2 puffs into the lungs every 6 (six) hours as needed for wheezing or shortness of breath. -     Spacer/Aero Chamber Mouthpiece MISC; Please dispense one spacer for use with inhaler. -     azithromycin (ZITHROMAX) 250 MG tablet; Take 2 the first day and then one each day after.  Follow up plan: prn  Rex Kras, MD [facility] 02/27/2015, 11:39 AM

## 2015-02-27 NOTE — Patient Instructions (Signed)
Netipot with distilled water 2-3 times a day to clear out sinuses Or Normal saline nasal spray Flonase steroid nasal spray Ibuprofen  three times a day Lots of fluids  Albuterol three times a day  Take azithromycin as prescribed

## 2015-06-03 DIAGNOSIS — W010XXA Fall on same level from slipping, tripping and stumbling without subsequent striking against object, initial encounter: Secondary | ICD-10-CM | POA: Diagnosis not present

## 2015-06-03 DIAGNOSIS — S63502A Unspecified sprain of left wrist, initial encounter: Secondary | ICD-10-CM | POA: Diagnosis not present

## 2015-06-03 DIAGNOSIS — Y929 Unspecified place or not applicable: Secondary | ICD-10-CM | POA: Diagnosis not present

## 2015-06-03 DIAGNOSIS — Y939 Activity, unspecified: Secondary | ICD-10-CM | POA: Diagnosis not present

## 2015-06-03 DIAGNOSIS — M25532 Pain in left wrist: Secondary | ICD-10-CM | POA: Diagnosis not present

## 2015-06-03 DIAGNOSIS — S62502A Fracture of unspecified phalanx of left thumb, initial encounter for closed fracture: Secondary | ICD-10-CM | POA: Diagnosis not present

## 2015-06-03 DIAGNOSIS — M7989 Other specified soft tissue disorders: Secondary | ICD-10-CM | POA: Diagnosis not present

## 2015-06-03 DIAGNOSIS — Y999 Unspecified external cause status: Secondary | ICD-10-CM | POA: Diagnosis not present

## 2015-06-03 DIAGNOSIS — R2232 Localized swelling, mass and lump, left upper limb: Secondary | ICD-10-CM | POA: Diagnosis not present

## 2016-03-04 ENCOUNTER — Ambulatory Visit: Payer: BLUE CROSS/BLUE SHIELD

## 2016-03-05 ENCOUNTER — Encounter: Payer: Self-pay | Admitting: Nurse Practitioner

## 2016-03-05 ENCOUNTER — Ambulatory Visit (INDEPENDENT_AMBULATORY_CARE_PROVIDER_SITE_OTHER): Payer: BLUE CROSS/BLUE SHIELD | Admitting: Nurse Practitioner

## 2016-03-05 VITALS — BP 109/79 | HR 77 | Temp 96.8°F | Ht 72.0 in | Wt 264.0 lb

## 2016-03-05 DIAGNOSIS — J320 Chronic maxillary sinusitis: Secondary | ICD-10-CM | POA: Diagnosis not present

## 2016-03-05 MED ORDER — CHLORPHEN-PE-ACETAMINOPHEN 4-10-325 MG PO TABS: 1.0000 | ORAL_TABLET | Freq: Four times a day (QID) | ORAL | 0 refills | Status: DC | PRN

## 2016-03-05 MED ORDER — LEVOFLOXACIN 500 MG PO TABS: 500.0000 mg | ORAL_TABLET | Freq: Every day | ORAL | 0 refills | Status: AC

## 2016-03-05 NOTE — Patient Instructions (Signed)

## 2016-03-05 NOTE — Progress Notes (Addendum)
Subjective:     Shawn [name] is a 46 y.o. male who presents for evaluation of sinus pain. Symptoms include: congestion, nasal congestion, purulent rhinorrhea and sinus pressure. Onset of symptoms was 4 days ago. Symptoms have been unchanged since that time. Past history is significant for no history of pneumonia or bronchitis. Patient is a former smoker, quit 1 MONTH ago.  The following portions of the patient's history were reviewed and updated as appropriate: allergies, current medications, past family history, past medical history, past social history, past surgical history and problem list.  Review of Systems Pertinent items noted in HPI and remainder of comprehensive ROS otherwise negative.   Objective:    BP 109/79   Pulse 77   Temp (!) 96.8 F (36 C) (Oral)   Ht 6' (1.829 m)   Wt 264 lb (119.7 kg)   BMI 35.80 kg/m  General appearance: alert, cooperative and mild distress Eyes: conjunctivae/corneas clear. PERRL, EOM's intact. Fundi benign. Ears: normal TM's and external ear canals both ears Nose: clear discharge, mild congestion, turbinates red, sinus tenderness bilateral Throat: lips, mucosa, and tongue normal; teeth and gums normal Neck: no adenopathy, no carotid bruit, no JVD, supple, symmetrical, trachea midline and thyroid not enlarged, symmetric, no tenderness/mass/nodules Lungs: clear to auscultation bilaterally Heart: regular rate and rhythm, S1, S2 normal, no murmur, click, rub or gallop    Assessment:    Acute bacterial sinusitis.    Plan:     1. Take meds as prescribed 2. Use a cool mist humidifier especially during the winter months and when heat has been humid. 3. Use saline nose sprays frequently 4. Saline irrigations of the nose can be very helpful if done frequently.  * 4X daily for 1 week*  * Use of a nettie pot can be helpful with this. Follow directions with this* 5. Drink plenty of fluids 6. Keep thermostat turn down low 7.For any cough or  congestion  Use plain Mucinex- regular strength or max strength is fine   * Children- consult with Pharmacist for dosing 8. For fever or aces or pains- take tylenol or ibuprofen appropriate for age and weight.  * for fevers greater than 101 orally you may alternate ibuprofen and tylenol every  3 hours.   Meds ordered this encounter  Medications  . levofloxacin (LEVAQUIN) 500 MG tablet    Sig: Take 1 tablet (500 mg total) by mouth daily.    Dispense:  10 tablet    Refill:  0    Order Specific Question:   Supervising Provider    Answer:   [name], Shawn [name] [[id number]]  . Chlorphen-PE-Acetaminophen 4-10-325 MG TABS    Sig: Take 1 tablet by mouth every 6 (six) hours as needed.    Dispense:  20 tablet    Refill:  0    Order Specific Question:   Supervising Provider    Answer:   Shawn [name] [[id number]]    Orders Placed This Encounter  Procedures  . Ambulatory referral to ENT    Referral Priority:   Routine    Referral Type:   Consultation    Referral Reason:   Specialty Services Required    Referred to Provider:   Pincus BadderBrian Matthews, [name]    Requested Specialty:   Otolaryngology    Number of Visits Requested:   1   Shawn [name]

## 2016-03-05 NOTE — Addendum Note (Signed)
Addended by: Bennie PieriniMARTIN, MARY-MARGARET on: 03/05/2016 08:52 AM   Modules accepted: Orders

## 2016-03-12 ENCOUNTER — Telehealth: Payer: Self-pay | Admitting: Pediatrics

## 2016-03-12 NOTE — Telephone Encounter (Signed)
[facility] [place] [place] [facility]

## 2016-03-19 DIAGNOSIS — M950 Acquired deformity of nose: Secondary | ICD-10-CM | POA: Diagnosis not present

## 2016-03-19 DIAGNOSIS — J329 Chronic sinusitis, unspecified: Secondary | ICD-10-CM | POA: Diagnosis not present

## 2016-03-19 DIAGNOSIS — G4730 Sleep apnea, unspecified: Secondary | ICD-10-CM | POA: Diagnosis not present

## 2016-03-19 DIAGNOSIS — J328 Other chronic sinusitis: Secondary | ICD-10-CM | POA: Diagnosis not present

## 2016-03-19 DIAGNOSIS — J3089 Other allergic rhinitis: Secondary | ICD-10-CM | POA: Diagnosis not present

## 2016-04-02 DIAGNOSIS — J301 Allergic rhinitis due to pollen: Secondary | ICD-10-CM | POA: Diagnosis not present

## 2016-04-09 DIAGNOSIS — J329 Chronic sinusitis, unspecified: Secondary | ICD-10-CM | POA: Diagnosis not present

## 2016-04-09 DIAGNOSIS — G4733 Obstructive sleep apnea (adult) (pediatric): Secondary | ICD-10-CM | POA: Diagnosis not present

## 2016-04-16 DIAGNOSIS — G4733 Obstructive sleep apnea (adult) (pediatric): Secondary | ICD-10-CM | POA: Diagnosis not present

## 2016-04-20 DIAGNOSIS — G4730 Sleep apnea, unspecified: Secondary | ICD-10-CM | POA: Diagnosis not present

## 2016-04-20 DIAGNOSIS — J342 Deviated nasal septum: Secondary | ICD-10-CM | POA: Diagnosis not present

## 2016-05-15 DIAGNOSIS — G4730 Sleep apnea, unspecified: Secondary | ICD-10-CM | POA: Diagnosis not present

## 2016-05-15 DIAGNOSIS — J342 Deviated nasal septum: Secondary | ICD-10-CM | POA: Diagnosis not present

## 2016-05-15 DIAGNOSIS — M950 Acquired deformity of nose: Secondary | ICD-10-CM | POA: Diagnosis not present

## 2016-05-22 DIAGNOSIS — J343 Hypertrophy of nasal turbinates: Secondary | ICD-10-CM | POA: Diagnosis not present

## 2016-05-22 DIAGNOSIS — J342 Deviated nasal septum: Secondary | ICD-10-CM | POA: Diagnosis not present

## 2019-09-05 DIAGNOSIS — Z20822 Contact with and (suspected) exposure to covid-19: Secondary | ICD-10-CM | POA: Diagnosis not present

## 2019-09-15 ENCOUNTER — Encounter (HOSPITAL_COMMUNITY): Payer: Self-pay

## 2019-09-15 ENCOUNTER — Encounter: Payer: Self-pay | Admitting: Family Medicine

## 2019-09-15 ENCOUNTER — Ambulatory Visit (INDEPENDENT_AMBULATORY_CARE_PROVIDER_SITE_OTHER): Payer: BLUE CROSS/BLUE SHIELD | Admitting: Family Medicine

## 2019-09-15 ENCOUNTER — Other Ambulatory Visit: Payer: Self-pay

## 2019-09-15 ENCOUNTER — Emergency Department (HOSPITAL_COMMUNITY): Payer: BC Managed Care – PPO

## 2019-09-15 ENCOUNTER — Inpatient Hospital Stay (HOSPITAL_COMMUNITY)
Admission: EM | Admit: 2019-09-15 | Discharge: 2019-09-19 | DRG: 177 | Disposition: A | Discharge status: Home / Self Care | Payer: BC Managed Care – PPO | Source: Ambulatory Visit | Attending: Internal Medicine | Admitting: Internal Medicine

## 2019-09-15 DIAGNOSIS — J9601 Acute respiratory failure with hypoxia: Secondary | ICD-10-CM | POA: Diagnosis present

## 2019-09-15 DIAGNOSIS — Z720 Tobacco use: Secondary | ICD-10-CM | POA: Diagnosis not present

## 2019-09-15 DIAGNOSIS — Z87891 Personal history of nicotine dependence: Secondary | ICD-10-CM

## 2019-09-15 DIAGNOSIS — R05 Cough: Secondary | ICD-10-CM | POA: Diagnosis not present

## 2019-09-15 DIAGNOSIS — R0602 Shortness of breath: Secondary | ICD-10-CM | POA: Diagnosis not present

## 2019-09-15 DIAGNOSIS — Z833 Family history of diabetes mellitus: Secondary | ICD-10-CM

## 2019-09-15 DIAGNOSIS — J1282 Pneumonia due to coronavirus disease 2019: Secondary | ICD-10-CM | POA: Diagnosis present

## 2019-09-15 DIAGNOSIS — U071 COVID-19: Secondary | ICD-10-CM

## 2019-09-15 DIAGNOSIS — A0839 Other viral enteritis: Secondary | ICD-10-CM | POA: Diagnosis not present

## 2019-09-15 DIAGNOSIS — R600 Localized edema: Secondary | ICD-10-CM

## 2019-09-15 DIAGNOSIS — Z809 Family history of malignant neoplasm, unspecified: Secondary | ICD-10-CM | POA: Diagnosis not present

## 2019-09-15 DIAGNOSIS — E872 Acidosis: Secondary | ICD-10-CM | POA: Diagnosis not present

## 2019-09-15 DIAGNOSIS — F419 Anxiety disorder, unspecified: Secondary | ICD-10-CM | POA: Diagnosis not present

## 2019-09-15 DIAGNOSIS — Z885 Allergy status to narcotic agent status: Secondary | ICD-10-CM

## 2019-09-15 DIAGNOSIS — R7989 Other specified abnormal findings of blood chemistry: Secondary | ICD-10-CM

## 2019-09-15 DIAGNOSIS — Z6835 Body mass index (BMI) 35.0-35.9, adult: Secondary | ICD-10-CM | POA: Diagnosis not present

## 2019-09-15 DIAGNOSIS — E669 Obesity, unspecified: Secondary | ICD-10-CM | POA: Diagnosis not present

## 2019-09-15 DIAGNOSIS — Z820 Family history of epilepsy and other diseases of the nervous system: Secondary | ICD-10-CM

## 2019-09-15 DIAGNOSIS — N62 Hypertrophy of breast: Secondary | ICD-10-CM | POA: Diagnosis present

## 2019-09-15 DIAGNOSIS — R112 Nausea with vomiting, unspecified: Secondary | ICD-10-CM | POA: Diagnosis present

## 2019-09-15 DIAGNOSIS — R069 Unspecified abnormalities of breathing: Secondary | ICD-10-CM | POA: Diagnosis not present

## 2019-09-15 DIAGNOSIS — R0902 Hypoxemia: Secondary | ICD-10-CM | POA: Diagnosis not present

## 2019-09-15 DIAGNOSIS — R918 Other nonspecific abnormal finding of lung field: Secondary | ICD-10-CM | POA: Diagnosis not present

## 2019-09-15 LAB — COMPREHENSIVE METABOLIC PANEL
ALT: 494 U/L — ABNORMAL HIGH (ref 0–44)
AST: 577 U/L — ABNORMAL HIGH (ref 15–41)
Albumin: 3.5 g/dL (ref 3.5–5.0)
Alkaline Phosphatase: 90 U/L (ref 38–126)
Anion gap: 13 (ref 5–15)
BUN: 24 mg/dL — ABNORMAL HIGH (ref 6–20)
CO2: 25 mmol/L (ref 22–32)
Calcium: 8.7 mg/dL — ABNORMAL LOW (ref 8.9–10.3)
Chloride: 97 mmol/L — ABNORMAL LOW (ref 98–111)
Creatinine, Ser: 1.04 mg/dL (ref 0.61–1.24)
GFR calc Af Amer: >60 mL/min (ref >60)
GFR calc non Af Amer: >60 mL/min (ref >60)
Glucose, Bld: 136 mg/dL — ABNORMAL HIGH (ref 70–99)
Potassium: 3.9 mmol/L (ref 3.5–5.1)
Sodium: 135 mmol/L (ref 135–145)
Total Bilirubin: 1 mg/dL (ref 0.3–1.2)
Total Protein: 7.2 g/dL (ref 6.5–8.1)

## 2019-09-15 LAB — TROPONIN I (HIGH SENSITIVITY)
Troponin I (High Sensitivity): 4 ng/L (ref <18)
Troponin I (High Sensitivity): 4 ng/L (ref <18)

## 2019-09-15 LAB — FIBRINOGEN: Fibrinogen: 612 mg/dL — ABNORMAL HIGH (ref 210–475)

## 2019-09-15 LAB — CBC WITH DIFFERENTIAL/PLATELET
Abs Immature Granulocytes: 0.06 10*3/uL (ref 0.00–0.07)
Basophils Absolute: 0 10*3/uL (ref 0.0–0.1)
Basophils Relative: 0 %
Eosinophils Absolute: 0 10*3/uL (ref 0.0–0.5)
Eosinophils Relative: 0 %
HCT: 45.8 % (ref 39.0–52.0)
Hemoglobin: 15.3 g/dL (ref 13.0–17.0)
Immature Granulocytes: 1 %
Lymphocytes Relative: 9 %
Lymphs Abs: 0.7 10*3/uL (ref 0.7–4.0)
MCH: 30 pg (ref 26.0–34.0)
MCHC: 33.4 g/dL (ref 30.0–36.0)
MCV: 89.8 fL (ref 80.0–100.0)
Monocytes Absolute: 0.5 10*3/uL (ref 0.1–1.0)
Monocytes Relative: 6 %
Neutro Abs: 7 10*3/uL (ref 1.7–7.7)
Neutrophils Relative %: 84 %
Platelets: 251 10*3/uL (ref 150–400)
RBC: 5.1 MIL/uL (ref 4.22–5.81)
RDW: 12.8 % (ref 11.5–15.5)
WBC: 8.3 10*3/uL (ref 4.0–10.5)
nRBC: 0 % (ref 0.0–0.2)

## 2019-09-15 LAB — LACTATE DEHYDROGENASE: LDH: 1132 U/L — ABNORMAL HIGH (ref 98–192)

## 2019-09-15 LAB — PROCALCITONIN: Procalcitonin: 0.28 ng/mL

## 2019-09-15 LAB — C-REACTIVE PROTEIN: CRP: 6.6 mg/dL — ABNORMAL HIGH (ref <1.0)

## 2019-09-15 LAB — TRIGLYCERIDES: Triglycerides: 279 mg/dL — ABNORMAL HIGH (ref <150)

## 2019-09-15 LAB — D-DIMER, QUANTITATIVE: D-Dimer, Quant: 1.01 ug/mL-FEU — ABNORMAL HIGH (ref 0.00–0.50)

## 2019-09-15 LAB — LACTIC ACID, PLASMA
Lactic Acid, Venous: 3 mmol/L (ref 0.5–1.9)
Lactic Acid, Venous: 2.2 mmol/L (ref 0.5–1.9)

## 2019-09-15 LAB — FERRITIN: Ferritin: >7500 ng/mL — ABNORMAL HIGH (ref 24–336)

## 2019-09-15 MED ORDER — ONDANSETRON HCL 4 MG PO TABS: 4.0000 mg | ORAL_TABLET | Freq: Four times a day (QID) | ORAL | Status: DC | PRN

## 2019-09-15 MED ORDER — ONDANSETRON HCL 4 MG/2ML IJ SOLN: 4.0000 mg | Freq: Four times a day (QID) | INTRAMUSCULAR | Status: DC | PRN

## 2019-09-15 MED ORDER — HYDROCOD POLST-CPM POLST ER 10-8 MG/5ML PO SUER: 5.0000 mL | Freq: Two times a day (BID) | ORAL | Status: DC | PRN

## 2019-09-15 MED ORDER — INSULIN ASPART 100 UNIT/ML ~~LOC~~ SOLN: 0.0000 [IU] | Freq: Every day | SUBCUTANEOUS | Status: DC

## 2019-09-15 MED ORDER — BARICITINIB 2 MG PO TABS
4.0000 mg | ORAL_TABLET | Freq: Every day | ORAL | Status: DC
  Administered 2019-09-15: 4 mg via ORAL
  Filled 2019-09-15: qty 2

## 2019-09-15 MED ORDER — SODIUM CHLORIDE 0.9 % IV SOLN
1000.0000 mL | INTRAVENOUS | Status: DC
  Administered 2019-09-15 – 2019-09-18 (×7): 1000 mL via INTRAVENOUS

## 2019-09-15 MED ORDER — METHYLPREDNISOLONE SODIUM SUCC 125 MG IJ SOLR
0.5000 mg/kg | Freq: Two times a day (BID) | INTRAMUSCULAR | Status: DC
  Administered 2019-09-16: 60 mg via INTRAVENOUS
  Filled 2019-09-15: qty 2

## 2019-09-15 MED ORDER — PREDNISONE 20 MG PO TABS: 50.0000 mg | ORAL_TABLET | Freq: Every day | ORAL | Status: DC

## 2019-09-15 MED ORDER — SODIUM CHLORIDE 0.9 % IV SOLN
100.0000 mg | INTRAVENOUS | Status: DC
  Administered 2019-09-15: 100 mg via INTRAVENOUS
  Filled 2019-09-15: qty 20

## 2019-09-15 MED ORDER — MELATONIN 3 MG PO TABS
3.0000 mg | ORAL_TABLET | Freq: Every day | ORAL | Status: DC
  Administered 2019-09-16 – 2019-09-18 (×4): 3 mg via ORAL
  Filled 2019-09-15 (×4): qty 1

## 2019-09-15 MED ORDER — ENOXAPARIN SODIUM 60 MG/0.6ML ~~LOC~~ SOLN
0.5000 mg/kg | Freq: Every day | SUBCUTANEOUS | Status: DC
  Administered 2019-09-16 – 2019-09-18 (×4): 60 mg via SUBCUTANEOUS
  Filled 2019-09-15 (×4): qty 0.6

## 2019-09-15 MED ORDER — IPRATROPIUM-ALBUTEROL 20-100 MCG/ACT IN AERS
2.0000 | INHALATION_SPRAY | Freq: Four times a day (QID) | RESPIRATORY_TRACT | Status: DC
  Administered 2019-09-16 – 2019-09-18 (×10): 2 via RESPIRATORY_TRACT
  Filled 2019-09-15: qty 4

## 2019-09-15 MED ORDER — METHYLPREDNISOLONE SODIUM SUCC 125 MG IJ SOLR
60.0000 mg | Freq: Once | INTRAMUSCULAR | Status: AC
  Administered 2019-09-15: 60 mg via INTRAVENOUS
  Filled 2019-09-15: qty 2

## 2019-09-15 MED ORDER — ZINC SULFATE 220 (50 ZN) MG PO CAPS
220.0000 mg | ORAL_CAPSULE | Freq: Every day | ORAL | Status: DC
  Administered 2019-09-16 – 2019-09-19 (×4): 220 mg via ORAL
  Filled 2019-09-15 (×4): qty 1

## 2019-09-15 MED ORDER — SODIUM CHLORIDE 0.9 % IV BOLUS
1000.0000 mL | Freq: Once | INTRAVENOUS | Status: AC
  Administered 2019-09-15: 1000 mL via INTRAVENOUS

## 2019-09-15 MED ORDER — SODIUM CHLORIDE 0.9 % IV BOLUS: 1000.0000 mL | Freq: Once | INTRAVENOUS | Status: DC

## 2019-09-15 MED ORDER — GUAIFENESIN-DM 100-10 MG/5ML PO SYRP
10.0000 mL | ORAL_SOLUTION | ORAL | Status: DC | PRN
  Administered 2019-09-17: 10 mL via ORAL
  Filled 2019-09-15: qty 10

## 2019-09-15 MED ORDER — ASCORBIC ACID 500 MG PO TABS
500.0000 mg | ORAL_TABLET | Freq: Every day | ORAL | Status: DC
  Administered 2019-09-16 – 2019-09-19 (×4): 500 mg via ORAL
  Filled 2019-09-15 (×4): qty 1

## 2019-09-15 MED ORDER — SODIUM CHLORIDE 0.9 % IV SOLN: 100.0000 mg | Freq: Every day | INTRAVENOUS | Status: DC

## 2019-09-15 MED ORDER — INSULIN ASPART 100 UNIT/ML ~~LOC~~ SOLN
0.0000 [IU] | Freq: Three times a day (TID) | SUBCUTANEOUS | Status: DC
  Administered 2019-09-16: 2 [IU] via SUBCUTANEOUS
  Administered 2019-09-16 – 2019-09-17 (×2): 3 [IU] via SUBCUTANEOUS
  Administered 2019-09-17: 5 [IU] via SUBCUTANEOUS
  Administered 2019-09-18: 2 [IU] via SUBCUTANEOUS
  Administered 2019-09-18 (×2): 3 [IU] via SUBCUTANEOUS
  Administered 2019-09-19: 2 [IU] via SUBCUTANEOUS
  Administered 2019-09-19: 5 [IU] via SUBCUTANEOUS

## 2019-09-15 MED ORDER — DIPHENHYDRAMINE HCL 25 MG PO CAPS
25.0000 mg | ORAL_CAPSULE | Freq: Once | ORAL | Status: AC
  Administered 2019-09-16: 25 mg via ORAL
  Filled 2019-09-15: qty 1

## 2019-09-15 NOTE — H&P (Signed)
History and Physical  Shawn [name] WFU:[id number] DOB: August 19, 1970 DOA: 09/15/2019  Referring physician: Dr. Charm Barges, ED physician PCP: Bennie Pierini, FNP  Outpatient Specialists: None  Patient Coming From: Home  Chief Complaint: Shortness of breath, weakness  HPI: Shawn [name] is a 49 y.o. male with no particular medical history.  Patient and diagnosed with COVID on 8/31.  He has been treated conservatively at home with Excedrin, Tylenol, ibuprofen.  He is unsure of doses as his wife has been giving him the medications.  He started having increasing shortness of breath over the past couple of days which has gradually increased.  He has been unable to talk in full sentences.  He went to a walk-in care who sent him here for further evaluation.  In the emergency department, the patient was acutely hypoxic to the 70s.  Was started on 6 L nasal cannula with gradual improvement.  Labs show positive Covid with increased CRP, normal procalcitonin and white count.  Of note, his LFTs were elevated into the 4-500 range.  He did get 1 dose of remdesivir in the emergency department, which was given to him by the EDP.  He denies abdominal pain, alcohol use, IV drug use, contact with other people's blood.  Review of Systems:   Pt denies any fevers, chills, nausea, vomiting, diarrhea, constipation, abdominal pain, shortness of breath, dyspnea on exertion, orthopnea, cough, wheezing, palpitations, headache, vision changes, lightheadedness, dizziness, melena, rectal bleeding.  Review of systems are otherwise negative  History reviewed. No pertinent past medical history. History reviewed. No pertinent surgical history. Social History:  reports that he has quit smoking. He has a 3.75 pack-year smoking history. He has never used smokeless tobacco. He reports current alcohol use. He reports that he does not use drugs.  Allergies  Allergen Reactions  . Codeine Other (See Comments)    HALLUCINATIONS     Family History  Problem Relation Age of Onset  . Diabetes Maternal Grandmother   . Alzheimer's disease Paternal Grandmother   . Cancer Paternal Grandfather        THROAT      Prior to Admission medications   Not on File    Physical Exam: BP 124/77   Pulse 70   Temp 97.7 F (36.5 C) (Oral)   Resp (!) 27   Ht 6' (1.829 m)   Wt 120.2 kg   SpO2 96%   BMI 35.94 kg/m   . General: Middle-age male. Awake and alert and oriented x3. No acute cardiopulmonary distress.  Marland Kitchen HEENT: Normocephalic atraumatic.  Right and left ears normal in appearance.  Pupils equal, round, reactive to light. Extraocular muscles are intact. Sclerae anicteric and noninjected.  Moist mucosal membranes. No mucosal lesions.  . Neck: Neck supple without lymphadenopathy. No carotid bruits. No masses palpated.  . Cardiovascular: Regular rate with normal S1-S2 sounds. No murmurs, rubs, gallops auscultated. No JVD.  Marland Kitchen Respiratory:   No accessory muscle use. . Abdomen: Soft, nontender, nondistended. Active bowel sounds. No masses or hepatosplenomegaly  . Skin: No rashes, lesions, or ulcerations.  Dry, warm to touch. 2+ dorsalis pedis and radial pulses. . Musculoskeletal: No calf or leg pain. All major joints not erythematous nontender.  No upper or lower joint deformation.  Good ROM.  No contractures  . Psychiatric: Intact judgment and insight. Pleasant and cooperative. . Neurologic: No focal neurological deficits. Strength is 5/5 and symmetric in upper and lower extremities.  Cranial nerves II through XII are grossly intact.  Labs on Admission: I have personally reviewed following labs and imaging studies  CBC: Recent Labs  Lab 09/15/19 1626  WBC 8.3  NEUTROABS 7.0  HGB 15.3  HCT 45.8  MCV 89.8  PLT 251   Basic Metabolic Panel: Recent Labs  Lab 09/15/19 1626  NA 135  K 3.9  CL 97*  CO2 25  GLUCOSE 136*  BUN 24*  CREATININE 1.04  CALCIUM 8.7*   GFR: Estimated Creatinine Clearance:  115 mL/min (by C-G formula based on SCr of 1.04 mg/dL). Liver Function Tests: Recent Labs  Lab 09/15/19 1626  AST 577*  ALT 494*  ALKPHOS 90  BILITOT 1.0  PROT 7.2  ALBUMIN 3.5   No results for input(s): LIPASE, AMYLASE in the last 168 hours. No results for input(s): AMMONIA in the last 168 hours. Coagulation Profile: No results for input(s): INR, PROTIME in the last 168 hours. Cardiac Enzymes: No results for input(s): CKTOTAL, CKMB, CKMBINDEX, TROPONINI in the last 168 hours. BNP (last 3 results) No results for input(s): PROBNP in the last 8760 hours. HbA1C: No results for input(s): HGBA1C in the last 72 hours. CBG: No results for input(s): GLUCAP in the last 168 hours. Lipid Profile: Recent Labs    09/15/19 1626  TRIG 279*   Thyroid Function Tests: No results for input(s): TSH, T4TOTAL, FREET4, T3FREE, THYROIDAB in the last 72 hours. Anemia Panel: Recent Labs    09/15/19 1626  FERRITIN >7,500*   Urine analysis:    Component Value Date/Time   BILIRUBINUR negative 06/07/2013 1405   PROTEINUR trace 06/07/2013 1405   UROBILINOGEN negative 06/07/2013 1405   NITRITE negative 06/07/2013 1405   LEUKOCYTESUR Negative 06/07/2013 1405   Sepsis Labs: @LABRCNTIP (procalcitonin:4,lacticidven:4) ) Recent Results (from the past 240 hour(s))  Blood Culture (routine x 2)     Status: None (Preliminary result)   Collection Time: 09/15/19  4:29 PM   Specimen: Right Antecubital; Blood  Result Value Ref Range Status   Specimen Description RIGHT ANTECUBITAL  Final   Special Requests   Final    BOTTLES DRAWN AEROBIC AND ANAEROBIC Blood Culture adequate volume Performed at [facility], [address], [place], [place]    Culture PENDING  Incomplete   Report Status PENDING  Incomplete  Blood Culture (routine x 2)     Status: None (Preliminary result)   Collection Time: 09/15/19  4:29 PM   Specimen: BLOOD RIGHT HAND  Result Value Ref Range Status   Specimen  Description BLOOD RIGHT HAND  Final   Special Requests   Final    BOTTLES DRAWN AEROBIC AND ANAEROBIC Blood Culture adequate volume Performed at [facility], [address], [place], [place]    Culture PENDING  Incomplete   Report Status PENDING  Incomplete     Radiological Exams on Admission: DG Chest Portable 1 View  Result Date: 09/15/2019 CLINICAL DATA:  Shortness of breath history of COVID EXAM: PORTABLE CHEST 1 VIEW COMPARISON:  03/07/2013 FINDINGS: Diffuse bilateral patchy and slightly nodular pulmonary opacity. No pleural effusion. Stable cardiomediastinal silhouette. No pneumothorax. IMPRESSION: Diffuse bilateral patchy and slightly nodular pulmonary opacity consistent most likely due to bilateral pneumonia and history of COVID positivity Electronically Signed   By: 05/07/2013 M.D.   On: 09/15/2019 15:30    EKG: Independently reviewed.  Sinus rhythm with no acute ST changes  Assessment/Plan: Principal Problem:   Acute respiratory failure with hypoxia (HCC) Active Problems:   Elevated LFTs   Pneumonia due to COVID-19 virus  This patient was discussed with the ED physician, including pertinent vitals, physical exam findings, labs, and imaging.  We also discussed care given by the ED provider.  1. Acute respiratory failure with hypoxia a. Continue respiratory support 2. Pneumonia due to COVID-19 Daily labs: CBC, CMP, CRP, D-dimer, ferritin, magnesium, phosphorus Steroids As patient has elevated LFTs, cannot give the patient remdesivir as his LFTs are too high.  We will see how his LFTs respond to the steroids.  If the LFTs are improving, may be able to start remdesivir We will give the patient Olumiant Proning Supplemental oxygen Zinc and vitamin C Albuterol HFA as needed Antitussives 3. Elevated LFTs a. Right upper quadrant ultrasound b. Acute hepatic panel c. We will check Tylenol level  DVT prophylaxis: Lovenox 0.5 mg/kg prophylaxis Consultants:  None Code Status: Full code Family Communication: None Disposition Plan: Patient should be able to return home following improvement   Levie Heritage, DO

## 2019-09-15 NOTE — ED Provider Notes (Signed)
[facility] EMERGENCY DEPARTMENT Provider Note   CSN: [id number] Arrival date & time: 09/15/19  1436     History Chief Complaint  Patient presents with  . Shortness of Breath    Shawn [name] is a 49 y.o. male.  He has no strength past medical history.  Intermittent tobacco.  Complaining of Covid-like symptoms that started about 11 days ago.  Was tested on the 31st and was diagnosed with Covid on the first.  Has been isolating at home.  Has received no additional treatment.  Complaining of increased shortness of breath cough productive of some white sputum fevers chills nausea vomiting diarrhea body aches and poor appetite.  Was more air hungry today and went to urgent care where her sats were in the mid 70s.  Improved with oxygen.  No hemoptysis.  Patient is not vaccinated.  The history is provided by the patient.  Shortness of Breath Severity:  Severe Onset quality:  Gradual Duration:  11 days Timing:  Intermittent Progression:  Worsening Chronicity:  New Context: activity and URI   Relieved by:  Nothing Worsened by:  Activity and coughing Ineffective treatments:  Rest Associated symptoms: chest pain, cough, fever, headaches, sore throat, sputum production and vomiting   Associated symptoms: no abdominal pain, no hemoptysis and no rash   Chest pain:    Quality: aching     Severity:  Moderate   Onset quality:  Gradual   Timing:  Intermittent   Progression:  Unchanged   Chronicity:  New      History reviewed. No pertinent past medical history.  There are no problems to display for this patient.   History reviewed. No pertinent surgical history.     Family History  Problem Relation Age of Onset  . Diabetes Maternal Grandmother   . Alzheimer's disease Paternal Grandmother   . Cancer Paternal Grandfather        THROAT    Social History   Tobacco Use  . Smoking status: Former Smoker    Packs/day: 0.25    Years: 15.00    Pack years: 3.75  . Smokeless  tobacco: Never Used  Substance Use Topics  . Alcohol use: Yes  . Drug use: No    Home Medications Prior to Admission medications   Medication Sig Start Date End Date Taking? Authorizing Provider  Chlorphen-PE-Acetaminophen 4-10-325 MG TABS Take 1 tablet by mouth every 6 (six) hours as needed. 03/05/16   Daphine Deutscher Mary-Margaret, FNP    Allergies    Codeine  Review of Systems   Review of Systems  Constitutional: Positive for chills, fatigue and fever.  HENT: Positive for sore throat.   Eyes: Negative for visual disturbance.  Respiratory: Positive for cough, sputum production and shortness of breath. Negative for hemoptysis.   Cardiovascular: Positive for chest pain.  Gastrointestinal: Positive for vomiting. Negative for abdominal pain.  Genitourinary: Negative for dysuria.  Musculoskeletal: Positive for myalgias.  Skin: Negative for rash.  Neurological: Positive for headaches.    Physical Exam Updated Vital Signs BP 122/88 (BP Location: Left Arm)   Pulse 74   Temp 97.7 F (36.5 C) (Oral)   Resp 16   Ht 6' (1.829 m)   Wt 120.2 kg   SpO2 94%   BMI 35.94 kg/m   Physical Exam Vitals and nursing note reviewed.  Constitutional:      Appearance: Normal appearance. He is well-developed.  HENT:     Head: Normocephalic and atraumatic.  Eyes:     Conjunctiva/sclera: Conjunctivae normal.  Cardiovascular:     Rate and Rhythm: Normal rate and regular rhythm.     Pulses: Normal pulses.     Heart sounds: No murmur heard.   Pulmonary:     Effort: Tachypnea and accessory muscle usage present. No respiratory distress.     Breath sounds: Rhonchi present.  Abdominal:     Palpations: Abdomen is soft.     Tenderness: There is no abdominal tenderness. There is no guarding or rebound.  Musculoskeletal:        General: Normal range of motion.     Cervical back: Neck supple.     Right lower leg: No edema.     Left lower leg: No edema.  Skin:    General: Skin is warm and dry.    Neurological:     General: No focal deficit present.     Mental Status: He is alert.     ED Results / Procedures / Treatments   Labs (all labs ordered are listed, but only abnormal results are displayed) Labs Reviewed  LACTIC ACID, PLASMA - Abnormal; Notable for the following components:      Result Value   Lactic Acid, Venous 3.0 (*)    All other components within normal limits  LACTIC ACID, PLASMA - Abnormal; Notable for the following components:   Lactic Acid, Venous 2.2 (*)    All other components within normal limits  COMPREHENSIVE METABOLIC PANEL - Abnormal; Notable for the following components:   Chloride 97 (*)    Glucose, Bld 136 (*)    BUN 24 (*)    Calcium 8.7 (*)    AST 577 (*)    ALT 494 (*)    All other components within normal limits  D-DIMER, QUANTITATIVE (NOT AT [place]) - Abnormal; Notable for the following components:   D-Dimer, Quant 1.01 (*)    All other components within normal limits  LACTATE DEHYDROGENASE - Abnormal; Notable for the following components:   LDH 1,132 (*)    All other components within normal limits  FERRITIN - Abnormal; Notable for the following components:   Ferritin >7,500 (*)    All other components within normal limits  TRIGLYCERIDES - Abnormal; Notable for the following components:   Triglycerides 279 (*)    All other components within normal limits  FIBRINOGEN - Abnormal; Notable for the following components:   Fibrinogen 612 (*)    All other components within normal limits  C-REACTIVE PROTEIN - Abnormal; Notable for the following components:   CRP 6.6 (*)    All other components within normal limits  ACETAMINOPHEN LEVEL - Abnormal; Notable for the following components:   Acetaminophen (Tylenol), Serum <10 (*)    All other components within normal limits  COMPREHENSIVE METABOLIC PANEL - Abnormal; Notable for the following components:   Glucose, Bld 151 (*)    BUN 27 (*)    Calcium 8.2 (*)    Total Protein 6.2 (*)     Albumin 3.0 (*)    AST 247 (*)    ALT 365 (*)    All other components within normal limits  D-DIMER, QUANTITATIVE (NOT AT [facility]) - Abnormal; Notable for the following components:   D-Dimer, Quant 1.05 (*)    All other components within normal limits  FERRITIN - Abnormal; Notable for the following components:   Ferritin 4,189 (*)    All other components within normal limits  C-REACTIVE PROTEIN - Abnormal; Notable for the following components:   CRP 3.1 (*)  All other components within normal limits  GLUCOSE, CAPILLARY - Abnormal; Notable for the following components:   Glucose-Capillary 150 (*)    All other components within normal limits  GLUCOSE, CAPILLARY - Abnormal; Notable for the following components:   Glucose-Capillary 146 (*)    All other components within normal limits  CULTURE, BLOOD (ROUTINE X 2)  CULTURE, BLOOD (ROUTINE X 2)  CBC WITH DIFFERENTIAL/PLATELET  PROCALCITONIN  CBC WITH DIFFERENTIAL/PLATELET  HEMOGLOBIN A1C  HEPATITIS PANEL, ACUTE  TROPONIN I (HIGH SENSITIVITY)  TROPONIN I (HIGH SENSITIVITY)    EKG EKG Interpretation  Date/Time:  Friday September 15 2019 14:44:59 EDT Ventricular Rate:  75 PR Interval:  124 QRS Duration: 80 QT Interval:  402 QTC Calculation: 448 R Axis:   -11 Text Interpretation: Normal sinus rhythm Cannot rule out Anterior infarct , age undetermined Abnormal ECG No old tracing to compare Confirmed by Meridee Score [phone]) on 09/15/2019 3:31:03 PM   Radiology DG Chest Portable 1 View  Result Date: 09/15/2019 CLINICAL DATA:  Shortness of breath history of COVID EXAM: PORTABLE CHEST 1 VIEW COMPARISON:  03/07/2013 FINDINGS: Diffuse bilateral patchy and slightly nodular pulmonary opacity. No pleural effusion. Stable cardiomediastinal silhouette. No pneumothorax. IMPRESSION: Diffuse bilateral patchy and slightly nodular pulmonary opacity consistent most likely due to bilateral pneumonia and history of COVID positivity Electronically  Signed   By: Jasmine Pang M.D.   On: 09/15/2019 15:30    Procedures .Critical Care Performed by: Terrilee Files, MD Authorized by: Terrilee Files, MD   Critical care provider statement:    Critical care time (minutes):  45   Critical care time was exclusive of:  Separately billable procedures and treating other patients   Critical care was necessary to treat or prevent imminent or life-threatening deterioration of the following conditions:  Respiratory failure   Critical care was time spent personally by me on the following activities:  Discussions with consultants, evaluation of patient's response to treatment, examination of patient, ordering and performing treatments and interventions, ordering and review of laboratory studies, ordering and review of radiographic studies, pulse oximetry, re-evaluation of patient's condition, obtaining history from patient or surrogate, review of old charts and development of treatment plan with patient or surrogate   I assumed direction of critical care for this patient from another provider in my specialty: no     (including critical care time)  Medications Ordered in ED Medications  0.9 %  sodium chloride infusion (1,000 mLs Intravenous New Bag/Given 09/16/19 0540)  sodium chloride 0.9 % bolus 1,000 mL (has no administration in time range)    Followed by  sodium chloride 0.9 % bolus 1,000 mL (1,000 mLs Intravenous New Bag/Given 09/15/19 2057)  enoxaparin (LOVENOX) injection 60 mg (60 mg Subcutaneous Given 09/16/19 0047)  Ipratropium-Albuterol (COMBIVENT) respimat 2 puff (2 puffs Inhalation Given 09/16/19 0817)  guaiFENesin-dextromethorphan (ROBITUSSIN DM) 100-10 MG/5ML syrup 10 mL (has no administration in time range)  chlorpheniramine-HYDROcodone (TUSSIONEX) 10-8 MG/5ML suspension 5 mL (has no administration in time range)  ascorbic acid (VITAMIN C) tablet 500 mg (500 mg Oral Given 09/16/19 0832)  zinc sulfate capsule 220 mg (220 mg Oral Given  09/16/19 0832)  insulin aspart (novoLOG) injection 0-15 Units (2 Units Subcutaneous Given 09/16/19 0839)  insulin aspart (novoLOG) injection 0-5 Units (0 Units Subcutaneous Not Given 09/16/19 0048)  ondansetron (ZOFRAN) tablet 4 mg (has no administration in time range)    Or  ondansetron (ZOFRAN) injection 4 mg (has no administration in time range)  melatonin tablet  3 mg (3 mg Oral Given 09/16/19 0048)  methylPREDNISolone sodium succinate (SOLU-MEDROL) 125 mg/2 mL injection 125 mg (has no administration in time range)  LORazepam (ATIVAN) injection 1 mg (has no administration in time range)  methylPREDNISolone sodium succinate (SOLU-MEDROL) 125 mg/2 mL injection 60 mg (60 mg Intravenous Given 09/15/19 1702)  diphenhydrAMINE (BENADRYL) capsule 25 mg (25 mg Oral Given 09/16/19 0048)  methylPREDNISolone sodium succinate (SOLU-MEDROL) 125 mg/2 mL injection 60 mg (60 mg Intravenous Given 09/16/19 9381)    ED Course  I have reviewed the triage vital signs and the nursing notes.  Pertinent labs & imaging results that were available during my care of the patient were reviewed by me and considered in my medical decision making (see chart for details).  Clinical Course as of Sep 16 1007  Fri Sep 15, 2019  1531 Chest x-ray interpreted by me as with diffuse pulmonary opacities.   [MB]  1805 Patient's lactate came back at 3.0.  I think this is reflective not of sepsis but Covid and work of breathing.  Will hold off on fluid bolus and antibiotics until discussion with hospitalist.   [MB]  0175 Discussed with Dr. Gust Rung [facility] hospitalist will evaluate the patient for admission.   [MB]    Clinical Course User Index [MB] Terrilee Files, MD   MDM Rules/Calculators/A&P                         Shawn [name] was evaluated in Emergency Department on 09/15/2019 for the symptoms described in the history of present illness. He was evaluated in the context of the global COVID-19 pandemic, which necessitated  consideration that the patient might be at risk for infection with the SARS-CoV-2 virus that causes COVID-19. Institutional protocols and algorithms that pertain to the evaluation of patients at risk for COVID-19 are in a state of rapid change based on information released by regulatory bodies including the CDC and federal and state organizations. These policies and algorithms were followed during the patient's care in the ED.  This patient complains of worsening shortness of breath in the setting of recent Covid diagnosis; this involves an extensive number of treatment Options and is a complaint that carries with it a high risk of complications and Morbidity. The differential includes hypoxia, Covid, Covid pneumonia, pneumothorax, PE  I ordered, reviewed and interpreted labs, which included CBC with normal white count normal hemoglobin, chemistries with elevated glucose elevated AST and ALT.  Inflammatory markers positive.  Lactate elevated.  Have not given sepsis fluids due to do not believe this is sepsis but Covid and increased work of breathing and metabolic demand I ordered medication IV steroids and remdesivir. I ordered imaging studies which included chest x-ray and I independently    visualized and interpreted imaging which showed multifocal pneumonia Previous records obtained and reviewed prior records in epic, no significant visits I consulted Dr. Adrian Blackwater and discussed lab and imaging findings  Critical Interventions: Management of Covid associated hypoxia and work of breathing with oxygen and IV steroids.  Will need admission to the hospital for continued care.  After the interventions stated above, I reevaluated the patient and found patient to be more comfortable on oxygen.  Currently on 4 L.  He is agreeable to plan for admission for further management.   Final Clinical Impression(s) / ED Diagnoses Final diagnoses:  Acute hypoxemic respiratory failure due to COVID-19 [facility])    Rx  / DC Orders ED Discharge Orders  None       Terrilee FilesButler, Mohanad Carsten C, MD 09/16/19 1014

## 2019-09-15 NOTE — ED Notes (Signed)
Attempted report x1. 

## 2019-09-15 NOTE — ED Triage Notes (Signed)
Pt presents to ED via [facility] sent from Urgent Care. Pt was diagnosed with Covid on 9/1. Pt states SOB got worse the last couple of days. Pt O2 sat 74% at [facility] placed on 15L and increased to 88%.

## 2019-09-15 NOTE — Progress Notes (Signed)
   Virtual Visit via telephone Note Due to COVID-19 pandemic this visit was conducted virtually. This visit type was conducted due to national recommendations for restrictions regarding the COVID-19 Pandemic (e.g. social distancing, sheltering in place) in an effort to limit this patient's exposure and mitigate transmission in our community. All issues noted in this document were discussed and addressed.  A physical exam was not performed with this format.  I connected with Shawn [name] on 09/15/19 at 0805 by telephone and verified that I am speaking with the correct person using two identifiers. Shawn [name] is currently located at home and his wife is currently with him during the visit. The provider, Gabriel Earing, FNP is located in their office at time of visit.  I discussed the limitations, risks, security and privacy concerns of performing an evaluation and management service by telephone and the availability of in person appointments. I also discussed with the patient that there may be a patient responsible charge related to this service. The patient expressed understanding and agreed to proceed.   History and Present Illness:  HPI  Shawn [name] was diagnosed with Covid on 8/31. He reports progressive shortness of breath. He has difficulty talking in sentences because of shortness of breath. He also has pain in his chest that feels tight in the morning that does improved as the day goes on. He reports that he is scared. He has a pulse ox meter at home. He wife asked if steroids would help him feel better.    Review of Systems  Respiratory: Positive for cough and shortness of breath.   Cardiovascular: Positive for chest pain.     Observations/Objective: Patient can be heard gasping for air and clearly has difficulty speaking more than a few words at a time. Pulse ox at 88% on home meter.  Assessment and Plan: Covid-19 with shortness of breath and low oxygen saturation. Discussed with wife  and patient that difficulty speaking in sentences at rest is very concerning and that Shawn [name] needs to be seen in the ED for further work up and treatment. Also discussed that 88% oxygen sat is low and he may need oxygen. Discussed that tightness is chest is also concerning. Instructed to go to ED at the time.    Follow Up Instructions: Instructed to go the ED at this time.     I discussed the assessment and treatment plan with the patient. The patient was provided an opportunity to ask questions and all were answered. The patient agreed with the plan and demonstrated an understanding of the instructions.   The patient was advised to call back or seek an in-person evaluation if the symptoms worsen or if the condition fails to improve as anticipated.  The above assessment and management plan was discussed with the patient. The patient verbalized understanding of and has agreed to the management plan. Patient is aware to call the clinic if symptoms persist or worsen. Patient is aware when to return to the clinic for a follow-up visit. Patient educated on when it is appropriate to go to the emergency department.   Time call ended:    I provided 17 minutes of non-face-to-face time during this encounter.    Gabriel Earing, FNP

## 2019-09-16 ENCOUNTER — Inpatient Hospital Stay (HOSPITAL_COMMUNITY): Payer: BC Managed Care – PPO

## 2019-09-16 DIAGNOSIS — R7989 Other specified abnormal findings of blood chemistry: Secondary | ICD-10-CM

## 2019-09-16 DIAGNOSIS — J9601 Acute respiratory failure with hypoxia: Secondary | ICD-10-CM

## 2019-09-16 DIAGNOSIS — E669 Obesity, unspecified: Secondary | ICD-10-CM

## 2019-09-16 DIAGNOSIS — U071 COVID-19: Principal | ICD-10-CM

## 2019-09-16 DIAGNOSIS — Z720 Tobacco use: Secondary | ICD-10-CM

## 2019-09-16 LAB — COMPREHENSIVE METABOLIC PANEL
ALT: 365 U/L — ABNORMAL HIGH (ref 0–44)
AST: 247 U/L — ABNORMAL HIGH (ref 15–41)
Albumin: 3 g/dL — ABNORMAL LOW (ref 3.5–5.0)
Alkaline Phosphatase: 86 U/L (ref 38–126)
Anion gap: 11 (ref 5–15)
BUN: 27 mg/dL — ABNORMAL HIGH (ref 6–20)
CO2: 24 mmol/L (ref 22–32)
Calcium: 8.2 mg/dL — ABNORMAL LOW (ref 8.9–10.3)
Chloride: 101 mmol/L (ref 98–111)
Creatinine, Ser: 0.94 mg/dL (ref 0.61–1.24)
GFR calc Af Amer: >60 mL/min (ref >60)
GFR calc non Af Amer: >60 mL/min (ref >60)
Glucose, Bld: 151 mg/dL — ABNORMAL HIGH (ref 70–99)
Potassium: 4.8 mmol/L (ref 3.5–5.1)
Sodium: 136 mmol/L (ref 135–145)
Total Bilirubin: 0.9 mg/dL (ref 0.3–1.2)
Total Protein: 6.2 g/dL — ABNORMAL LOW (ref 6.5–8.1)

## 2019-09-16 LAB — CBC WITH DIFFERENTIAL/PLATELET
Abs Immature Granulocytes: 0.04 10*3/uL (ref 0.00–0.07)
Basophils Absolute: 0 10*3/uL (ref 0.0–0.1)
Basophils Relative: 1 %
Eosinophils Absolute: 0 10*3/uL (ref 0.0–0.5)
Eosinophils Relative: 0 %
HCT: 41.9 % (ref 39.0–52.0)
Hemoglobin: 13.7 g/dL (ref 13.0–17.0)
Immature Granulocytes: 1 %
Lymphocytes Relative: 17 %
Lymphs Abs: 1 10*3/uL (ref 0.7–4.0)
MCH: 29.9 pg (ref 26.0–34.0)
MCHC: 32.7 g/dL (ref 30.0–36.0)
MCV: 91.5 fL (ref 80.0–100.0)
Monocytes Absolute: 0.4 10*3/uL (ref 0.1–1.0)
Monocytes Relative: 6 %
Neutro Abs: 4.6 10*3/uL (ref 1.7–7.7)
Neutrophils Relative %: 75 %
Platelets: 275 10*3/uL (ref 150–400)
RBC: 4.58 MIL/uL (ref 4.22–5.81)
RDW: 13.2 % (ref 11.5–15.5)
WBC: 6.1 10*3/uL (ref 4.0–10.5)
nRBC: 0 % (ref 0.0–0.2)

## 2019-09-16 LAB — D-DIMER, QUANTITATIVE: D-Dimer, Quant: 1.05 ug/mL-FEU — ABNORMAL HIGH (ref 0.00–0.50)

## 2019-09-16 LAB — HEPATITIS PANEL, ACUTE
HCV Ab: NONREACTIVE
Hep A IgM: NONREACTIVE
Hep B C IgM: NONREACTIVE
Hepatitis B Surface Ag: NONREACTIVE

## 2019-09-16 LAB — FERRITIN: Ferritin: 4189 ng/mL — ABNORMAL HIGH (ref 24–336)

## 2019-09-16 LAB — HEMOGLOBIN A1C
Hgb A1c MFr Bld: 5.8 % — ABNORMAL HIGH (ref 4.8–5.6)
Mean Plasma Glucose: 119.76 mg/dL

## 2019-09-16 LAB — GLUCOSE, CAPILLARY
Glucose-Capillary: 146 mg/dL — ABNORMAL HIGH (ref 70–99)
Glucose-Capillary: 148 mg/dL — ABNORMAL HIGH (ref 70–99)
Glucose-Capillary: 150 mg/dL — ABNORMAL HIGH (ref 70–99)
Glucose-Capillary: 155 mg/dL — ABNORMAL HIGH (ref 70–99)
Glucose-Capillary: 183 mg/dL — ABNORMAL HIGH (ref 70–99)

## 2019-09-16 LAB — ACETAMINOPHEN LEVEL: Acetaminophen (Tylenol), Serum: <10 ug/mL — ABNORMAL LOW (ref 10–30)

## 2019-09-16 LAB — C-REACTIVE PROTEIN: CRP: 3.1 mg/dL — ABNORMAL HIGH (ref <1.0)

## 2019-09-16 LAB — BRAIN NATRIURETIC PEPTIDE: B Natriuretic Peptide: 77 pg/mL (ref 0.0–100.0)

## 2019-09-16 MED ORDER — METHYLPREDNISOLONE SODIUM SUCC 125 MG IJ SOLR
125.0000 mg | Freq: Two times a day (BID) | INTRAMUSCULAR | Status: DC
  Administered 2019-09-16 – 2019-09-19 (×5): 125 mg via INTRAVENOUS
  Filled 2019-09-16 (×6): qty 2

## 2019-09-16 MED ORDER — BARICITINIB 2 MG PO TABS
4.0000 mg | ORAL_TABLET | Freq: Every day | ORAL | Status: DC
  Administered 2019-09-16: 4 mg via ORAL
  Filled 2019-09-16 (×2): qty 2

## 2019-09-16 MED ORDER — DIPHENHYDRAMINE HCL 25 MG PO CAPS
25.0000 mg | ORAL_CAPSULE | Freq: Once | ORAL | Status: AC
  Administered 2019-09-16: 25 mg via ORAL
  Filled 2019-09-16: qty 1

## 2019-09-16 MED ORDER — METHYLPREDNISOLONE SODIUM SUCC 125 MG IJ SOLR
60.0000 mg | Freq: Once | INTRAMUSCULAR | Status: AC
  Administered 2019-09-16: 60 mg via INTRAVENOUS
  Filled 2019-09-16: qty 2

## 2019-09-16 MED ORDER — LORAZEPAM 2 MG/ML IJ SOLN
1.0000 mg | Freq: Four times a day (QID) | INTRAMUSCULAR | Status: DC | PRN
  Administered 2019-09-16 – 2019-09-18 (×5): 1 mg via INTRAVENOUS
  Filled 2019-09-16 (×6): qty 1

## 2019-09-16 MED ORDER — MELATONIN 3 MG PO TABS
3.0000 mg | ORAL_TABLET | Freq: Once | ORAL | Status: AC
  Administered 2019-09-16: 3 mg via ORAL
  Filled 2019-09-16: qty 1

## 2019-09-16 NOTE — Progress Notes (Addendum)
PROGRESS NOTE  Shawn [name] BBY:[id number] DOB: 03/02/1970 DOA: 09/15/2019 PCP: [name], FNP  Brief History:  49 year old male with no documented chronic medical problems presenting with shortness of breath.  The patient was diagnosed with COVID-19 on 09/05/2019.  The patient had been treating himself conservatively at home with Excedrin, Tylenol, and ibuprofen.  He did not seek medical care with his primary care provider.  Unfortunately, the patient began having worsening shortness of breath over the past 3 days prior to admission.  He was unable to talk in full sentences.  The patient went to urgent care on 09/15/2019, and he was noted to be hypoxic.  He was directed to the emergency department.  He required escalating amounts of oxygen up to 8 L nasal cannula.  In the emergency department, the patient was afebrile hemodynamically stable but hypoxic into the 70s.  BMP and CBC were essentially unremarkable.  AST 577, ALT 494, alk phosphatase 90, total bilirubin 1.0.  The patient was started on IV steroids.  Remdesivir was held secondary to his elevated LFTs.  The patient was also started on baricitinib.  Notably, the patient has not been vaccinated for COVID-19.   Assessment/Plan: Acute respiratory failure with hypoxia secondary to COVID-19 -Presently requiring 8 L nasal cannula -Continue IV steroids -Remdesivir currently being held secondary to elevated LFTs -Continue baricitinib and monitor LFTs closely -CRP 6.6>>3.1 -D-dimer 1.01>>1.05 -Ferritin >7500>>4189 -Continue vitamin C and zinc -Continue Combivent  Tobacco abuse -The patient has approximately 20-pack-year history -Tobacco cessation discussed -continue combivent  Transaminasemia -due to COVID-19 -continue to trend -hep B surface antigen -hep C antibody  Anxiety -Start as needed Ativan  Lactic Acidosis  -more due to hypoxia  Class II Obesity -lifestyle modification -higher risk for morbidity and  mortality from COVID-19 -BMI 35.94     Status is: Inpatient  Remains inpatient appropriate because:IV treatments appropriate due to intensity of illness or inability to take PO   Dispo: The patient is from: Home              Anticipated d/c is to: Home              Anticipated d/c date is: 2 days              Patient currently is not medically stable to d/c.        Family Communication:   Mother updated 9/11  Consultants:  none  Code Status:  FULL   DVT Prophylaxis:  [place] Lovenox   Procedures: As Listed in Progress Note Above  Antibiotics: None       Subjective: Patient complains of dry cough which causes his sob to worsen.  Denies f/c, cp, n/v/d, abd pain.  He remains sob, not better since yesterday  Objective: Vitals:   09/16/19 0042 09/16/19 0043 09/16/19 0530 09/16/19 0817  BP:   101/76   Pulse: 74  64   Resp: (!) 28  (!) 26   Temp:   97.9 F (36.6 C)   TempSrc:      SpO2: 94% 94% 91% (!) 89%  Weight:      Height:        Intake/Output Summary (Last 24 hours) at 09/16/2019 1016 Last data filed at 09/16/2019 0300 Gross per 24 hour  Intake 2081.67 ml  Output --  Net 2081.67 ml   Weight change:  Exam:   General:  Pt is alert, follows commands appropriately, not in acute distress  HEENT: No icterus, No thrush, No neck mass, [place]/AT  Cardiovascular: RRR, S1/S2, no rubs, no gallops  Respiratory: diminished BS; bibasilar rales. No wheeze  Abdomen: Soft/+BS, non tender, non distended, no guarding  Extremities: No edema, No lymphangitis, No petechiae, No rashes, no synovitis   Data Reviewed: I have personally reviewed following labs and imaging studies Basic Metabolic Panel: Recent Labs  Lab 09/15/19 1626 09/16/19 0721  NA 135 136  K 3.9 4.8  CL 97* 101  CO2 25 24  GLUCOSE 136* 151*  BUN 24* 27*  CREATININE 1.04 0.94  CALCIUM 8.7* 8.2*   Liver Function Tests: Recent Labs  Lab 09/15/19 1626 09/16/19 0721  AST 577* 247*  ALT  494* 365*  ALKPHOS 90 86  BILITOT 1.0 0.9  PROT 7.2 6.2*  ALBUMIN 3.5 3.0*   No results for input(s): LIPASE, AMYLASE in the last 168 hours. No results for input(s): AMMONIA in the last 168 hours. Coagulation Profile: No results for input(s): INR, PROTIME in the last 168 hours. CBC: Recent Labs  Lab 09/15/19 1626 09/16/19 0721  WBC 8.3 6.1  NEUTROABS 7.0 4.6  HGB 15.3 13.7  HCT 45.8 41.9  MCV 89.8 91.5  PLT 251 275   Cardiac Enzymes: No results for input(s): CKTOTAL, CKMB, CKMBINDEX, TROPONINI in the last 168 hours. BNP: Invalid input(s): POCBNP CBG: Recent Labs  Lab 09/16/19 0046 09/16/19 0838  GLUCAP 150* 146*   HbA1C: No results for input(s): HGBA1C in the last 72 hours. Urine analysis:    Component Value Date/Time   BILIRUBINUR negative 06/07/2013 1405   PROTEINUR trace 06/07/2013 1405   UROBILINOGEN negative 06/07/2013 1405   NITRITE negative 06/07/2013 1405   LEUKOCYTESUR Negative 06/07/2013 1405   Sepsis Labs: @LABRCNTIP (procalcitonin:4,lacticidven:4) ) Recent Results (from the past 240 hour(s))  Blood Culture (routine x 2)     Status: None (Preliminary result)   Collection Time: 09/15/19  4:29 PM   Specimen: Right Antecubital; Blood  Result Value Ref Range Status   Specimen Description RIGHT ANTECUBITAL  Final   Special Requests   Final    BOTTLES DRAWN AEROBIC AND ANAEROBIC Blood Culture adequate volume Performed at [facility], [address] [place], [place]    Culture PENDING  Incomplete   Report Status PENDING  Incomplete  Blood Culture (routine x 2)     Status: None (Preliminary result)   Collection Time: 09/15/19  4:29 PM   Specimen: BLOOD RIGHT HAND  Result Value Ref Range Status   Specimen Description BLOOD RIGHT HAND  Final   Special Requests   Final    BOTTLES DRAWN AEROBIC AND ANAEROBIC Blood Culture adequate volume Performed at [facility], [address] [place], [place]    Culture PENDING  Incomplete    Report Status PENDING  Incomplete     Scheduled Meds: . vitamin C  500 mg Oral Daily  . baricitinib  4 mg Oral Daily  . enoxaparin (LOVENOX) injection  0.5 mg/kg Subcutaneous QHS  . insulin aspart  0-15 Units Subcutaneous TID WC  . insulin aspart  0-5 Units Subcutaneous QHS  . Ipratropium-Albuterol  2 puff Inhalation Q6H  . melatonin  3 mg Oral QHS  . methylPREDNISolone (SOLU-MEDROL) injection  125 mg Intravenous Q12H  . zinc sulfate  220 mg Oral Daily   Continuous Infusions: . sodium chloride 1,000 mL (09/16/19 0540)  . sodium chloride      Procedures/Studies: DG Chest Portable 1 View  Result Date: 09/15/2019 CLINICAL DATA:  Shortness of breath history of COVID EXAM: PORTABLE CHEST 1 VIEW COMPARISON:  03/07/2013 FINDINGS: Diffuse bilateral patchy and slightly nodular pulmonary opacity. No pleural effusion. Stable cardiomediastinal silhouette. No pneumothorax. IMPRESSION: Diffuse bilateral patchy and slightly nodular pulmonary opacity consistent most likely due to bilateral pneumonia and history of COVID positivity Electronically Signed   By: Donavan Foil M.D.   On: 09/15/2019 15:30    Orson Eva, DO  [facility]  If 7PM-7AM, please contact night-coverage [email or web address] Password [email or web address] 09/16/2019, 10:16 AM   LOS: 1 day

## 2019-09-16 NOTE — Progress Notes (Signed)
Started to give inhaler - patient starts coughing and working himself up saying I'm scared. This is before he even gets inhaler. Finally managed to take inhaler twice. Still coughing  Managed to get patient to do incentive. Coughing triggers his panic , Oxygen drops at this time also. 96 to 91.

## 2019-09-16 NOTE — Progress Notes (Signed)
Started patient on Combivent mdi . Once patient started coughing he became short of breath and had what appears to be a panic attack. He stated this happens at night. Changed oxygen over to high flow nasal cannula. Patient not able to do incentive and/or flutter at this time. This appears to be more anxiety than covid.

## 2019-09-17 DIAGNOSIS — J1282 Pneumonia due to coronavirus disease 2019: Secondary | ICD-10-CM

## 2019-09-17 LAB — CBC WITH DIFFERENTIAL/PLATELET
Abs Immature Granulocytes: 0.07 10*3/uL (ref 0.00–0.07)
Basophils Absolute: 0 10*3/uL (ref 0.0–0.1)
Basophils Relative: 0 %
Eosinophils Absolute: 0 10*3/uL (ref 0.0–0.5)
Eosinophils Relative: 0 %
HCT: 42.1 % (ref 39.0–52.0)
Hemoglobin: 13.6 g/dL (ref 13.0–17.0)
Immature Granulocytes: 1 %
Lymphocytes Relative: 7 %
Lymphs Abs: 0.8 10*3/uL (ref 0.7–4.0)
MCH: 29.8 pg (ref 26.0–34.0)
MCHC: 32.3 g/dL (ref 30.0–36.0)
MCV: 92.3 fL (ref 80.0–100.0)
Monocytes Absolute: 0.5 10*3/uL (ref 0.1–1.0)
Monocytes Relative: 5 %
Neutro Abs: 9.5 10*3/uL — ABNORMAL HIGH (ref 1.7–7.7)
Neutrophils Relative %: 87 %
Platelets: 300 10*3/uL (ref 150–400)
RBC: 4.56 MIL/uL (ref 4.22–5.81)
RDW: 12.9 % (ref 11.5–15.5)
WBC: 11 10*3/uL — ABNORMAL HIGH (ref 4.0–10.5)
nRBC: 0 % (ref 0.0–0.2)

## 2019-09-17 LAB — COMPREHENSIVE METABOLIC PANEL
ALT: 345 U/L — ABNORMAL HIGH (ref 0–44)
AST: 155 U/L — ABNORMAL HIGH (ref 15–41)
Albumin: 2.9 g/dL — ABNORMAL LOW (ref 3.5–5.0)
Alkaline Phosphatase: 87 U/L (ref 38–126)
Anion gap: 9 (ref 5–15)
BUN: 28 mg/dL — ABNORMAL HIGH (ref 6–20)
CO2: 26 mmol/L (ref 22–32)
Calcium: 8.3 mg/dL — ABNORMAL LOW (ref 8.9–10.3)
Chloride: 101 mmol/L (ref 98–111)
Creatinine, Ser: 0.93 mg/dL (ref 0.61–1.24)
GFR calc Af Amer: >60 mL/min (ref >60)
GFR calc non Af Amer: >60 mL/min (ref >60)
Glucose, Bld: 165 mg/dL — ABNORMAL HIGH (ref 70–99)
Potassium: 4.8 mmol/L (ref 3.5–5.1)
Sodium: 136 mmol/L (ref 135–145)
Total Bilirubin: 0.9 mg/dL (ref 0.3–1.2)
Total Protein: 6.1 g/dL — ABNORMAL LOW (ref 6.5–8.1)

## 2019-09-17 LAB — C-REACTIVE PROTEIN: CRP: 1.2 mg/dL — ABNORMAL HIGH (ref <1.0)

## 2019-09-17 LAB — FERRITIN: Ferritin: 2659 ng/mL — ABNORMAL HIGH (ref 24–336)

## 2019-09-17 LAB — BRAIN NATRIURETIC PEPTIDE: B Natriuretic Peptide: 117 pg/mL — ABNORMAL HIGH (ref 0.0–100.0)

## 2019-09-17 LAB — D-DIMER, QUANTITATIVE: D-Dimer, Quant: 2.63 ug/mL-FEU — ABNORMAL HIGH (ref 0.00–0.50)

## 2019-09-17 LAB — HEPATITIS B SURFACE ANTIGEN: Hepatitis B Surface Ag: NONREACTIVE

## 2019-09-17 LAB — GLUCOSE, CAPILLARY
Glucose-Capillary: 156 mg/dL — ABNORMAL HIGH (ref 70–99)
Glucose-Capillary: 227 mg/dL — ABNORMAL HIGH (ref 70–99)
Glucose-Capillary: 111 mg/dL — ABNORMAL HIGH (ref 70–99)
Glucose-Capillary: 189 mg/dL — ABNORMAL HIGH (ref 70–99)

## 2019-09-17 LAB — HEPATITIS C ANTIBODY: HCV Ab: NONREACTIVE

## 2019-09-17 MED ORDER — SALINE SPRAY 0.65 % NA SOLN
1.0000 | NASAL | Status: DC | PRN
  Administered 2019-09-17: 1 via NASAL

## 2019-09-17 MED ORDER — LORAZEPAM 1 MG PO TABS
1.0000 mg | ORAL_TABLET | Freq: Once | ORAL | Status: AC
  Administered 2019-09-17: 1 mg via ORAL
  Filled 2019-09-17: qty 1

## 2019-09-17 MED ORDER — PREDNISONE 20 MG PO TABS
120.0000 mg | ORAL_TABLET | Freq: Once | ORAL | Status: AC
  Administered 2019-09-17: 120 mg via ORAL
  Filled 2019-09-17: qty 6

## 2019-09-17 MED ORDER — BARICITINIB 2 MG PO TABS
4.0000 mg | ORAL_TABLET | Freq: Once | ORAL | Status: AC
  Administered 2019-09-17: 4 mg via ORAL
  Filled 2019-09-17: qty 2

## 2019-09-17 NOTE — Progress Notes (Signed)
PROGRESS NOTE  Shawn [name] MVH:[id number] DOB: [birth year] DOA: 09/15/2019 PCP: [name], FNP  Brief History:  49 year old male with no documented chronic medical problems presenting with shortness of breath.  The patient was diagnosed with COVID-19 on 09/05/2019.  The patient had been treating himself conservatively at home with Excedrin, Tylenol, and ibuprofen.  He did not seek medical care with his primary care provider.  Unfortunately, the patient began having worsening shortness of breath over the past 3 days prior to admission.  He was unable to talk in full sentences.  The patient went to urgent care on 09/15/2019, and he was noted to be hypoxic.  He was directed to the emergency department.  He required escalating amounts of oxygen up to 8 L nasal cannula.  In the emergency department, the patient was afebrile hemodynamically stable but hypoxic into the 70s.  BMP and CBC were essentially unremarkable.  AST 577, ALT 494, alk phosphatase 90, total bilirubin 1.0.  The patient was started on IV steroids.  Remdesivir was held secondary to his elevated LFTs.  The patient was also started on baricitinib.  Notably, the patient has not been vaccinated for COVID-19.   Assessment/Plan: Acute respiratory failure with hypoxia secondary to COVID-19 -Presently requiring 8 L [place]>>5-6L -Continue IV steroids -Remdesivir currently being held secondary to elevated LFTs -Continue baricitinib and monitor LFTs closely -CRP 6.6>>3.1>>1.2 -D-dimer 1.01>>1.05>>2.63 -Ferritin >7500>>4189>>2659 -Continue vitamin C and zinc -Continue Combivent -CTA chest -9/12--lost IV access--had to give am steroid in po form  Tobacco abuse -The patient has approximately 20-pack-year history -Tobacco cessation discussed -continue combivent  Transaminasemia -due to COVID-19 -continue to trend-->stable, trending down -hep B surface antigen -hep C antibody  Anxiety -Start as needed  Ativan  Lactic Acidosis  -more due to hypoxia  Class II Obesity -lifestyle modification -higher risk for morbidity and mortality from COVID-19 -BMI 35.94     Status is: Inpatient  Remains inpatient appropriate because:IV treatments appropriate due to intensity of illness or inability to take PO   Dispo: The patient is from: Home  Anticipated d/c is to: Home  Anticipated d/c date is: 2 days  Patient currently is not medically stable to d/c.        Family Communication:   Mother updated 9/12  Consultants:  none  Code Status:  FULL   DVT Prophylaxis:  [place] Lovenox   Procedures: As Listed in Progress Note Above  Antibiotics: None     Subjective: Patient frustrated about being in hospital; wants to go home.  States breathing a little better, but it "comes and goes".  Denies cp, n/v/d, f/c, headache  Objective: Vitals:   09/17/19 0528 09/17/19 0758 09/17/19 1231 09/17/19 1423  BP: 102/76   116/86  Pulse: 82   70  Resp: 18   18  Temp:    98.4 F (36.9 C)  TempSrc:    Oral  SpO2: 99% 95% 94% 97%  Weight:      Height:        Intake/Output Summary (Last 24 hours) at 09/17/2019 1442 Last data filed at 09/17/2019 1300 Gross per 24 hour  Intake --  Output 400 ml  Net -400 ml   Weight change:  Exam:   General:  Pt is alert, follows commands appropriately, not in acute distress  HEENT: No icterus, No thrush, No neck mass, [place]/AT  Cardiovascular: RRR, S1/S2, no rubs, no gallops  Respiratory: bilateral rales. No wheeze  Abdomen: Soft/+BS,  non tender, non distended, no guarding  Extremities: trace LE edema, No lymphangitis, No petechiae, No rashes, no synovitis   Data Reviewed: I have personally reviewed following labs and imaging studies Basic Metabolic Panel: Recent Labs  Lab 09/15/19 1626 09/16/19 0721 09/17/19 0736  NA 135 136 136  K 3.9 4.8 4.8  CL 97* 101 101  CO2 25 24 26    GLUCOSE 136* 151* 165*  BUN 24* 27* 28*  CREATININE 1.04 0.94 0.93  CALCIUM 8.7* 8.2* 8.3*   Liver Function Tests: Recent Labs  Lab 09/15/19 1626 09/16/19 0721 09/17/19 0736  AST 577* 247* 155*  ALT 494* 365* 345*  ALKPHOS 90 86 87  BILITOT 1.0 0.9 0.9  PROT 7.2 6.2* 6.1*  ALBUMIN 3.5 3.0* 2.9*   No results for input(s): LIPASE, AMYLASE in the last 168 hours. No results for input(s): AMMONIA in the last 168 hours. Coagulation Profile: No results for input(s): INR, PROTIME in the last 168 hours. CBC: Recent Labs  Lab 09/15/19 1626 09/16/19 0721 09/17/19 0736  WBC 8.3 6.1 11.0*  NEUTROABS 7.0 4.6 9.5*  HGB 15.3 13.7 13.6  HCT 45.8 41.9 42.1  MCV 89.8 91.5 92.3  PLT 251 275 300   Cardiac Enzymes: No results for input(s): CKTOTAL, CKMB, CKMBINDEX, TROPONINI in the last 168 hours. BNP: Invalid input(s): POCBNP CBG: Recent Labs  Lab 09/16/19 1127 09/16/19 1649 09/16/19 2142 09/17/19 0731 09/17/19 1106  GLUCAP 183* 155* 148* 156* 227*   HbA1C: Recent Labs    09/15/19 1842  HGBA1C 5.8*   Urine analysis:    Component Value Date/Time   BILIRUBINUR negative 06/07/2013 1405   PROTEINUR trace 06/07/2013 1405   UROBILINOGEN negative 06/07/2013 1405   NITRITE negative 06/07/2013 1405   LEUKOCYTESUR Negative 06/07/2013 1405   Sepsis Labs: @LABRCNTIP (procalcitonin:4,lacticidven:4) ) Recent Results (from the past 240 hour(s))  Blood Culture (routine x 2)     Status: None (Preliminary result)   Collection Time: 09/15/19  4:29 PM   Specimen: Right Antecubital; Blood  Result Value Ref Range Status   Specimen Description RIGHT ANTECUBITAL  Final   Special Requests   Final    BOTTLES DRAWN AEROBIC AND ANAEROBIC Blood Culture adequate volume   Culture   Final    NO GROWTH < 24 HOURS Performed at [facility], [address], [place], [place] [address]    Report Status PENDING  Incomplete  Blood Culture (routine x 2)     Status: None (Preliminary result)    Collection Time: 09/15/19  4:29 PM   Specimen: BLOOD RIGHT HAND  Result Value Ref Range Status   Specimen Description BLOOD RIGHT HAND  Final   Special Requests   Final    BOTTLES DRAWN AEROBIC AND ANAEROBIC Blood Culture adequate volume   Culture   Final    NO GROWTH < 24 HOURS Performed at [facility], [address] [place], [place] [address]    Report Status PENDING  Incomplete     Scheduled Meds: . vitamin C  500 mg Oral Daily  . baricitinib  4 mg Oral Once  . enoxaparin (LOVENOX) injection  0.5 mg/kg Subcutaneous QHS  . insulin aspart  0-15 Units Subcutaneous TID WC  . insulin aspart  0-5 Units Subcutaneous QHS  . Ipratropium-Albuterol  2 puff Inhalation Q6H  . melatonin  3 mg Oral QHS  . methylPREDNISolone (SOLU-MEDROL) injection  125 mg Intravenous Q12H  . zinc sulfate  220 mg Oral Daily   Continuous Infusions: . sodium chloride  1,000 mL (09/16/19 2150)  . sodium chloride      Procedures/Studies: DG Chest Portable 1 View  Result Date: 09/15/2019 CLINICAL DATA:  Shortness of breath history of COVID EXAM: PORTABLE CHEST 1 VIEW COMPARISON:  03/07/2013 FINDINGS: Diffuse bilateral patchy and slightly nodular pulmonary opacity. No pleural effusion. Stable cardiomediastinal silhouette. No pneumothorax. IMPRESSION: Diffuse bilateral patchy and slightly nodular pulmonary opacity consistent most likely due to bilateral pneumonia and history of COVID positivity Electronically Signed   By: Donavan Foil M.D.   On: 09/15/2019 15:30   US Abdomen Limited RUQ  Result Date: 09/16/2019 CLINICAL DATA:  Elevated LFTs EXAM: ULTRASOUND ABDOMEN LIMITED RIGHT UPPER QUADRANT COMPARISON:  CT abdomen and pelvis 06/09/2013 FINDINGS: Gallbladder: Normally distended without stones or wall thickening. No pericholecystic fluid or sonographic Murphy sign. Common bile duct: Diameter: 4 mm, normal Liver: Echogenic parenchyma, likely fatty infiltration though this can be seen with cirrhosis and certain  infiltrative disorders. No focal hepatic mass or nodularity. Portal vein patent with normal direction of blood flow towards the liver. Other: No RIGHT upper quadrant free fluid. IMPRESSION: Probable fatty infiltration of liver as above. Otherwise negative exam. Electronically Signed   By: Lavonia Dana M.D.   On: 09/16/2019 14:31    Orson Eva, DO  [facility]  If 7PM-7AM, please contact night-coverage [email or web address] Password [email or web address] 09/17/2019, 2:42 PM   LOS: 2 days

## 2019-09-18 ENCOUNTER — Other Ambulatory Visit: Payer: Self-pay | Admitting: Diagnostic Radiology

## 2019-09-18 ENCOUNTER — Inpatient Hospital Stay (HOSPITAL_COMMUNITY): Payer: BC Managed Care – PPO

## 2019-09-18 LAB — COMPREHENSIVE METABOLIC PANEL
ALT: 367 U/L — ABNORMAL HIGH (ref 0–44)
AST: 108 U/L — ABNORMAL HIGH (ref 15–41)
Albumin: 2.6 g/dL — ABNORMAL LOW (ref 3.5–5.0)
Alkaline Phosphatase: 87 U/L (ref 38–126)
Anion gap: 9 (ref 5–15)
BUN: 25 mg/dL — ABNORMAL HIGH (ref 6–20)
CO2: 25 mmol/L (ref 22–32)
Calcium: 7.9 mg/dL — ABNORMAL LOW (ref 8.9–10.3)
Chloride: 103 mmol/L (ref 98–111)
Creatinine, Ser: 0.88 mg/dL (ref 0.61–1.24)
GFR calc Af Amer: >60 mL/min (ref >60)
GFR calc non Af Amer: >60 mL/min (ref >60)
Glucose, Bld: 140 mg/dL — ABNORMAL HIGH (ref 70–99)
Potassium: 4.4 mmol/L (ref 3.5–5.1)
Sodium: 137 mmol/L (ref 135–145)
Total Bilirubin: 0.8 mg/dL (ref 0.3–1.2)
Total Protein: 5.3 g/dL — ABNORMAL LOW (ref 6.5–8.1)

## 2019-09-18 LAB — CBC WITH DIFFERENTIAL/PLATELET
Abs Immature Granulocytes: 0.14 10*3/uL — ABNORMAL HIGH (ref 0.00–0.07)
Basophils Absolute: 0 10*3/uL (ref 0.0–0.1)
Basophils Relative: 0 %
Eosinophils Absolute: 0 10*3/uL (ref 0.0–0.5)
Eosinophils Relative: 0 %
HCT: 38.6 % — ABNORMAL LOW (ref 39.0–52.0)
Hemoglobin: 12.6 g/dL — ABNORMAL LOW (ref 13.0–17.0)
Immature Granulocytes: 1 %
Lymphocytes Relative: 6 %
Lymphs Abs: 0.6 10*3/uL — ABNORMAL LOW (ref 0.7–4.0)
MCH: 29.4 pg (ref 26.0–34.0)
MCHC: 32.6 g/dL (ref 30.0–36.0)
MCV: 90.2 fL (ref 80.0–100.0)
Monocytes Absolute: 0.7 10*3/uL (ref 0.1–1.0)
Monocytes Relative: 6 %
Neutro Abs: 9.4 10*3/uL — ABNORMAL HIGH (ref 1.7–7.7)
Neutrophils Relative %: 87 %
Platelets: 261 10*3/uL (ref 150–400)
RBC: 4.28 MIL/uL (ref 4.22–5.81)
RDW: 12.4 % (ref 11.5–15.5)
WBC: 10.8 10*3/uL — ABNORMAL HIGH (ref 4.0–10.5)
nRBC: 0 % (ref 0.0–0.2)

## 2019-09-18 LAB — GLUCOSE, CAPILLARY
Glucose-Capillary: 125 mg/dL — ABNORMAL HIGH (ref 70–99)
Glucose-Capillary: 176 mg/dL — ABNORMAL HIGH (ref 70–99)
Glucose-Capillary: 154 mg/dL — ABNORMAL HIGH (ref 70–99)
Glucose-Capillary: 144 mg/dL — ABNORMAL HIGH (ref 70–99)

## 2019-09-18 LAB — FERRITIN: Ferritin: 1903 ng/mL — ABNORMAL HIGH (ref 24–336)

## 2019-09-18 LAB — C-REACTIVE PROTEIN: CRP: 0.9 mg/dL (ref <1.0)

## 2019-09-18 LAB — D-DIMER, QUANTITATIVE: D-Dimer, Quant: 6.12 ug/mL-FEU — ABNORMAL HIGH (ref 0.00–0.50)

## 2019-09-18 MED ORDER — IOHEXOL 350 MG/ML SOLN
100.0000 mL | Freq: Once | INTRAVENOUS | Status: AC | PRN
  Administered 2019-09-18: 100 mL via INTRAVENOUS

## 2019-09-18 MED ORDER — IPRATROPIUM-ALBUTEROL 20-100 MCG/ACT IN AERS
2.0000 | INHALATION_SPRAY | Freq: Three times a day (TID) | RESPIRATORY_TRACT | Status: DC
  Administered 2019-09-19 (×2): 2 via RESPIRATORY_TRACT

## 2019-09-18 MED ORDER — BARICITINIB 2 MG PO TABS
4.0000 mg | ORAL_TABLET | Freq: Once | ORAL | Status: AC
  Administered 2019-09-18: 4 mg via ORAL
  Filled 2019-09-18: qty 2

## 2019-09-18 NOTE — Progress Notes (Signed)
Pt had oxygen off while rounding. 79% on room air. Oxygen reapplied and increased to 15 L with HFNC. Sats improved to 91%. Upon recheck sats holding at 96%. HFNC decreased to 10 L. O2 sats currently 95%. Will recheck and titrate oxygen accordingly.

## 2019-09-18 NOTE — Plan of Care (Signed)

## 2019-09-18 NOTE — Progress Notes (Signed)
PROGRESS NOTE  Shawn [name] IRJ:[id number] DOB: 10-19-1970 DOA: 09/15/2019 PCP: [name], FNP  Brief History: 49 year old male with no documented chronic medical problems presenting with shortness of breath. The patient was diagnosed with COVID-19 on 09/05/2019. The patient had been treating himself conservatively at home with Excedrin, Tylenol, and ibuprofen. He did not seek medical care with his primary care provider. Unfortunately, the patient began having worsening shortness of breath over the past 3 days prior to admission. He was unable to talk in full sentences. The patient went to urgent care on 09/15/2019, and he was noted to be hypoxic. He was directed to the emergency department. He required escalating amounts of oxygen up to 8 L nasal cannula. In the emergency department, the patient was afebrile hemodynamically stable but hypoxic into the 70s. BMP and CBC were essentially unremarkable. AST 577, ALT 494, alk phosphatase 90, total bilirubin 1.0. The patient was started on IV steroids. Remdesivir was held secondary to his elevated LFTs. The patient was also started on baricitinib.Notably, the patient has not been vaccinated for COVID-19.   Assessment/Plan: Acute respiratory failure with hypoxia secondary to COVID-19 -Presently requiring 8 L Roberts>>5-6L -Continue IV steroids -Remdesivir currently being held secondary to elevated LFTs -Continue baricitinib and monitor LFTs closely -CRP 6.6>>3.1>>1.2>>0.9 -D-dimer 1.01>>1.05>>2.63>>6.12 -Ferritin>7500>>4189>>2659>>1903 -Continue vitamin C and zinc -Continue Combivent -CTA chest--neg for PE; bilateral GGO -9/12--lost IV access--had to give am steroid in po form  Gynecomastia -incidental finding on CTA chest -out patient mammogram  Tobacco abuse -The patient has approximately 20-pack-year history -Tobacco cessation discussed -continue combivent  Transaminasemia -due to COVID-19 -continue  to trend-->stable, trending down -hep B surface antigen--neg -hep C antibody--neg  Anxiety -Start as needed Ativan  Lactic Acidosis  -more due to hypoxia  Class II Obesity -lifestyle modification -higher risk for morbidity and mortality from COVID-19 -BMI 35.94     Status is: Inpatient  Remains inpatient appropriate because:IV treatments appropriate due to intensity of illness or inability to take PO   Dispo: The patient is from:Home Anticipated d/c is TK:ZSWF Anticipated d/c date is: 1 day Patient currently is not medically stable to d/c.        Family Communication:Mother updated 9/12  Consultants:none  Code Status: FULL   DVT Prophylaxis: [place] Lovenox   Procedures: As Listed in Progress Note Above  Antibiotics: None      Subjective: Patient is tearful about staying in hospital.  He feels his breathing is slowly improving.  Denies f/c, cp, n/v/d  Objective: Vitals:   09/17/19 2000 09/18/19 0400 09/18/19 1213 09/18/19 1414  BP: 118/89 97/67 123/87   Pulse: 75 70 69   Resp: 18 18 18    Temp: 97.7 F (36.5 C) 98.2 F (36.8 C) 98.5 F (36.9 C)   TempSrc: Oral Oral Oral   SpO2: 95% 91% 92% 96%  Weight:      Height:        Intake/Output Summary (Last 24 hours) at 09/18/2019 1647 Last data filed at 09/18/2019 1300 Gross per 24 hour  Intake 1680 ml  Output --  Net 1680 ml   Weight change:  Exam:   General:  Pt is alert, follows commands appropriately, not in acute distress  HEENT: No icterus, No thrush, No neck mass, [place]/AT  Cardiovascular: RRR, S1/S2, no rubs, no gallops  Respiratory: bilateral rales. No wheeze  Abdomen: Soft/+BS, non tender, non distended, no guarding  Extremities: No edema, No lymphangitis, No petechiae, No rashes,  no synovitis   Data Reviewed: I have personally reviewed following labs and imaging studies Basic Metabolic Panel: Recent Labs   Lab 09/15/19 1626 09/16/19 0721 09/17/19 0736 09/18/19 0724  NA 135 136 136 137  K 3.9 4.8 4.8 4.4  CL 97* 101 101 103  CO2 25 24 26 25   GLUCOSE 136* 151* 165* 140*  BUN 24* 27* 28* 25*  CREATININE 1.04 0.94 0.93 0.88  CALCIUM 8.7* 8.2* 8.3* 7.9*   Liver Function Tests: Recent Labs  Lab 09/15/19 1626 09/16/19 0721 09/17/19 0736 09/18/19 0724  AST 577* 247* 155* 108*  ALT 494* 365* 345* 367*  ALKPHOS 90 86 87 87  BILITOT 1.0 0.9 0.9 0.8  PROT 7.2 6.2* 6.1* 5.3*  ALBUMIN 3.5 3.0* 2.9* 2.6*   No results for input(s): LIPASE, AMYLASE in the last 168 hours. No results for input(s): AMMONIA in the last 168 hours. Coagulation Profile: No results for input(s): INR, PROTIME in the last 168 hours. CBC: Recent Labs  Lab 09/15/19 1626 09/16/19 0721 09/17/19 0736 09/18/19 0724  WBC 8.3 6.1 11.0* 10.8*  NEUTROABS 7.0 4.6 9.5* 9.4*  HGB 15.3 13.7 13.6 12.6*  HCT 45.8 41.9 42.1 38.6*  MCV 89.8 91.5 92.3 90.2  PLT 251 275 300 261   Cardiac Enzymes: No results for input(s): CKTOTAL, CKMB, CKMBINDEX, TROPONINI in the last 168 hours. BNP: Invalid input(s): POCBNP CBG: Recent Labs  Lab 09/17/19 1602 09/17/19 2100 09/18/19 0731 09/18/19 1116 09/18/19 1629  GLUCAP 111* 189* 125* 176* 154*   HbA1C: Recent Labs    09/15/19 1842  HGBA1C 5.8*   Urine analysis:    Component Value Date/Time   BILIRUBINUR negative 06/07/2013 1405   PROTEINUR trace 06/07/2013 1405   UROBILINOGEN negative 06/07/2013 1405   NITRITE negative 06/07/2013 1405   LEUKOCYTESUR Negative 06/07/2013 1405   Sepsis Labs: @LABRCNTIP (procalcitonin:4,lacticidven:4) ) Recent Results (from the past 240 hour(s))  Blood Culture (routine x 2)     Status: None (Preliminary result)   Collection Time: 09/15/19  4:29 PM   Specimen: Right Antecubital; Blood  Result Value Ref Range Status   Specimen Description RIGHT ANTECUBITAL  Final   Special Requests   Final    BOTTLES DRAWN AEROBIC AND ANAEROBIC  Blood Culture adequate volume   Culture   Final    NO GROWTH 3 DAYS Performed at [facility], [address] [place], [place] [address]    Report Status PENDING  Incomplete  Blood Culture (routine x 2)     Status: None (Preliminary result)   Collection Time: 09/15/19  4:29 PM   Specimen: BLOOD RIGHT HAND  Result Value Ref Range Status   Specimen Description BLOOD RIGHT HAND  Final   Special Requests   Final    BOTTLES DRAWN AEROBIC AND ANAEROBIC Blood Culture adequate volume   Culture   Final    NO GROWTH 3 DAYS Performed at [facility], [address], [place], [place] [address]    Report Status PENDING  Incomplete     Scheduled Meds: . vitamin C  500 mg Oral Daily  . enoxaparin (LOVENOX) injection  0.5 mg/kg Subcutaneous QHS  . insulin aspart  0-15 Units Subcutaneous TID WC  . insulin aspart  0-5 Units Subcutaneous QHS  . Ipratropium-Albuterol  2 puff Inhalation Q6H  . melatonin  3 mg Oral QHS  . methylPREDNISolone (SOLU-MEDROL) injection  125 mg Intravenous Q12H  . zinc sulfate  220 mg Oral Daily   Continuous Infusions: . sodium chloride 1,000  mL (09/18/19 0939)  . sodium chloride      Procedures/Studies: CT ANGIO CHEST PE W OR WO CONTRAST  Result Date: 09/18/2019 CLINICAL DATA:  Shortness of breath and cough, diagnosed with COVID-19 on 09/06/2019. History of former smoker, elevated D dimer, checks x-ray today showing diffuse bilateral patchy and slightly nodular pulmonary opacities consistent with bilateral pneumonia. EXAM: CT ANGIOGRAPHY CHEST WITH CONTRAST TECHNIQUE: Multidetector CT imaging of the chest was performed using the standard protocol during bolus administration of intravenous contrast. Multiplanar CT image reconstructions and MIPs were obtained to evaluate the vascular anatomy. CONTRAST:  191m OMNIPAQUE IOHEXOL 350 MG/ML SOLN COMPARISON:  Chest x-ray 09/15/2019, CT chest 06/28/2014 FINDINGS: Cardiovascular: Satisfactory opacification of the pulmonary arteries  to the segmental level. Suggestion of vascular cut offs within the right lower lobe; however, limited evaluation due to timing of contrast. The main pulmonary artery measures at the upper limits of normal (3 cm). No evidence of pulmonary embolism. Normal heart size. No significant pericardial effusion. The ascending thoracic aorta is stable in caliber measuring to 3.9 cm. The descending thoracic aorta is normal in caliber. No thoracic aorta atherosclerotic plaque. No definite coronary artery calcification. Mediastinum/Nodes: Multiple prominent as well as several mildly enlarged mediastinal lymph nodes with as an example a 1.1 cm right paratracheal lymph node (5:75), and a right precarinal lymph node measuring 1.2 cm (5:89), as well as a 0.9 cm prevascular lymph node (5:89). Several posterior mediastinal lymph nodes are prominent size with ascending example a 0.8 cm (5:20) and 0.9 cm (9:173) periaortic lymph nodes. Bilateral enlarged perihilar lymphadenopathy with as an example a 1.2 cm right (5:131) and a 0.9 cm left (5:119) lymph node. No axillary lymphadenopathy. Lungs/Pleura: Extensive diffuse peribronchovascular and peripheral patchy consolidative opacities. Linear atelectasis versus scarring in the left lower lobe. No definite pulmonary mass. Central airways are patent. Upper Abdomen: No acute abnormality. Musculoskeletal: Interval development of asymmetric right greater than left gynecomastia. No suspicious lytic or blastic osseous lesions. No acute displaced fracture. Multilevel degenerative changes of the spine. Review of the MIP images confirms the above findings. IMPRESSION: 1. No central or segmental pulmonary embolus. Limited evaluation at the subsegmental level due to timing of contrast. 2. Extensive diffuse consolidative peribronchovascular and peripheral consolidations consistent with bilateral pneumonia in a patient with known COVID-19 infection. 3. Likely reactive mediastinal and hilar  lymphadenopathy. 4. Interval development of asymmetric right greater than left gynecomastia. Given asymmetry, consider non-emergent mammographic/sonographic evaluation. Electronically Signed   By: MIven FinnM.D.   On: 09/18/2019 15:44   DG Chest Portable 1 View  Result Date: 09/15/2019 CLINICAL DATA:  Shortness of breath history of COVID EXAM: PORTABLE CHEST 1 VIEW COMPARISON:  03/07/2013 FINDINGS: Diffuse bilateral patchy and slightly nodular pulmonary opacity. No pleural effusion. Stable cardiomediastinal silhouette. No pneumothorax. IMPRESSION: Diffuse bilateral patchy and slightly nodular pulmonary opacity consistent most likely due to bilateral pneumonia and history of COVID positivity Electronically Signed   By: KDonavan FoilM.D.   On: 09/15/2019 15:30   [place]Abdomen Limited RUQ  Result Date: 09/16/2019 CLINICAL DATA:  Elevated LFTs EXAM: ULTRASOUND ABDOMEN LIMITED RIGHT UPPER QUADRANT COMPARISON:  CT abdomen and pelvis 06/09/2013 FINDINGS: Gallbladder: Normally distended without stones or wall thickening. No pericholecystic fluid or sonographic Murphy sign. Common bile duct: Diameter: 4 mm, normal Liver: Echogenic parenchyma, likely fatty infiltration though this can be seen with cirrhosis and certain infiltrative disorders. No focal hepatic mass or nodularity. Portal vein patent with normal direction of blood flow towards the  liver. Other: No RIGHT upper quadrant free fluid. IMPRESSION: Probable fatty infiltration of liver as above. Otherwise negative exam. Electronically Signed   By: Lavonia Dana M.D.   On: 09/16/2019 14:31    Orson Eva, DO  [facility]  If 7PM-7AM, please contact night-coverage [email or web address] Password [facility] 09/18/2019, 4:47 PM   LOS: 3 days

## 2019-09-18 NOTE — Progress Notes (Signed)
Patient ID: Shawn [name], male   DOB: 27-Feb-1970, 49 y.o.   MRN: [id number] After the patient was scanned, I was called to the CT suite in order to investigate an extravasation.   The patient's IV had already been removed.  In the right arm antecubital region, there is mild local soft tissue swelling.  Distal strength was good with normal capillary refill and normal pulses as well as normal sensation in the fingers.  Review of the CT indicates that most or nearly all of the contrast is intravascular, and I anticipate the amount of extravasated fluid at about 50 cc, of which 25 cc is thought to probably be contrast, and 25 cc saline.  We applied an ice pack to the antecubital region and mildly elevated the arm.  I will write an order for ice packs to be applied twice a day until swelling subsides, and notify medical staff if there is any progressive cutaneous discoloration signs of distal neurovascular compromise.

## 2019-09-19 ENCOUNTER — Inpatient Hospital Stay (HOSPITAL_COMMUNITY): Payer: BC Managed Care – PPO

## 2019-09-19 DIAGNOSIS — R7989 Other specified abnormal findings of blood chemistry: Secondary | ICD-10-CM

## 2019-09-19 LAB — GLUCOSE, CAPILLARY
Glucose-Capillary: 137 mg/dL — ABNORMAL HIGH (ref 70–99)
Glucose-Capillary: 226 mg/dL — ABNORMAL HIGH (ref 70–99)

## 2019-09-19 LAB — COMPREHENSIVE METABOLIC PANEL
ALT: 262 U/L — ABNORMAL HIGH (ref 0–44)
AST: 46 U/L — ABNORMAL HIGH (ref 15–41)
Albumin: 2.6 g/dL — ABNORMAL LOW (ref 3.5–5.0)
Alkaline Phosphatase: 83 U/L (ref 38–126)
Anion gap: 8 (ref 5–15)
BUN: 22 mg/dL — ABNORMAL HIGH (ref 6–20)
CO2: 23 mmol/L (ref 22–32)
Calcium: 7.8 mg/dL — ABNORMAL LOW (ref 8.9–10.3)
Chloride: 105 mmol/L (ref 98–111)
Creatinine, Ser: 0.86 mg/dL (ref 0.61–1.24)
GFR calc Af Amer: >60 mL/min (ref >60)
GFR calc non Af Amer: >60 mL/min (ref >60)
Glucose, Bld: 156 mg/dL — ABNORMAL HIGH (ref 70–99)
Potassium: 4.4 mmol/L (ref 3.5–5.1)
Sodium: 136 mmol/L (ref 135–145)
Total Bilirubin: 0.7 mg/dL (ref 0.3–1.2)
Total Protein: 5.4 g/dL — ABNORMAL LOW (ref 6.5–8.1)

## 2019-09-19 LAB — CBC WITH DIFFERENTIAL/PLATELET
Abs Immature Granulocytes: 0.27 10*3/uL — ABNORMAL HIGH (ref 0.00–0.07)
Basophils Absolute: 0 10*3/uL (ref 0.0–0.1)
Basophils Relative: 0 %
Eosinophils Absolute: 0 10*3/uL (ref 0.0–0.5)
Eosinophils Relative: 0 %
HCT: 38.9 % — ABNORMAL LOW (ref 39.0–52.0)
Hemoglobin: 13 g/dL (ref 13.0–17.0)
Immature Granulocytes: 2 %
Lymphocytes Relative: 4 %
Lymphs Abs: 0.4 10*3/uL — ABNORMAL LOW (ref 0.7–4.0)
MCH: 30.2 pg (ref 26.0–34.0)
MCHC: 33.4 g/dL (ref 30.0–36.0)
MCV: 90.3 fL (ref 80.0–100.0)
Monocytes Absolute: 0.5 10*3/uL (ref 0.1–1.0)
Monocytes Relative: 5 %
Neutro Abs: 10 10*3/uL — ABNORMAL HIGH (ref 1.7–7.7)
Neutrophils Relative %: 89 %
Platelets: 234 10*3/uL (ref 150–400)
RBC: 4.31 MIL/uL (ref 4.22–5.81)
RDW: 12.4 % (ref 11.5–15.5)
WBC: 11.2 10*3/uL — ABNORMAL HIGH (ref 4.0–10.5)
nRBC: 0 % (ref 0.0–0.2)

## 2019-09-19 LAB — D-DIMER, QUANTITATIVE: D-Dimer, Quant: 11.4 ug/mL-FEU — ABNORMAL HIGH (ref 0.00–0.50)

## 2019-09-19 LAB — C-REACTIVE PROTEIN: CRP: 2.2 mg/dL — ABNORMAL HIGH (ref <1.0)

## 2019-09-19 LAB — FERRITIN: Ferritin: 1340 ng/mL — ABNORMAL HIGH (ref 24–336)

## 2019-09-19 MED ORDER — PREDNISONE 50 MG PO TABS
100.0000 mg | ORAL_TABLET | Freq: Two times a day (BID) | ORAL | 0 refills | Status: DC
Start: 2019-09-19 — End: 2019-09-30

## 2019-09-19 MED ORDER — BARICITINIB 2 MG PO TABS
4.0000 mg | ORAL_TABLET | Freq: Every day | ORAL | Status: DC
  Administered 2019-09-19: 4 mg via ORAL
  Filled 2019-09-19: qty 2

## 2019-09-19 MED ORDER — ASCORBIC ACID 500 MG PO TABS: 500.0000 mg | ORAL_TABLET | Freq: Every day | ORAL | Status: DC

## 2019-09-19 MED ORDER — PREDNISONE 20 MG PO TABS: 100.0000 mg | ORAL_TABLET | Freq: Two times a day (BID) | ORAL | Status: DC

## 2019-09-19 MED ORDER — ZINC SULFATE 220 (50 ZN) MG PO CAPS: 220.0000 mg | ORAL_CAPSULE | Freq: Every day | ORAL | Status: DC

## 2019-09-19 NOTE — Plan of Care (Signed)

## 2019-09-19 NOTE — Discharge Summary (Signed)
Physician Discharge Summary  Shawn [name] WSF:[id number] DOB: 10/17/1970 DOA: 09/15/2019  PCP: [name], FNP  Admit date: 09/15/2019 Discharge date: 09/19/2019  Admitted From:Home Disposition:  Home   Recommendations for Outpatient Follow-up:  1. Follow up with PCP in 1-2 weeks 2. Please obtain BMP/CBC in one week  Home Health: YES Equipment/Devices: 4L [place]  Discharge Condition: Stable CODE STATUS: FULL Diet recommendation: Heart Healthy / Carb Modified    Brief/Interim Summary: 49 year old male with no documented chronic medical problems presenting with shortness of breath. The patient was diagnosed with COVID-19 on 09/05/2019. The patient had been treating himself conservatively at home with Excedrin, Tylenol, and ibuprofen. He did not seek medical care with his primary care provider. Unfortunately, the patient began having worsening shortness of breath over the past 3 days prior to admission. He was unable to talk in full sentences. The patient went to urgent care on 09/15/2019, and he was noted to be hypoxic. He was directed to the emergency department. He required escalating amounts of oxygen up to 8 L nasal cannula. In the emergency department, the patient was afebrile hemodynamically stable but hypoxic into the 70s. BMP and CBC were essentially unremarkable. AST 577, ALT 494, alk phosphatase 90, total bilirubin 1.0. The patient was started on IV steroids. Remdesivir was held secondary to his elevated LFTs. The patient was also started on baricitinib.Notably, the patient has not been vaccinated for COVID-19.  His LFTs trended downward during the hospitalization, but not to a point where he would be able to receive remdesivir.  Fortunately, the patient improved clinically with IV steroids and baricitinib and he was weaned to 4L.  He was set up with home oxygen at 4L and go home with 6 more days prednisone.   Discharge Diagnoses:   Acute respiratory failure  with hypoxia secondary to COVID-19 -Presently requiring 8 LNC>>5-6L -Continue IV steroids -Remdesivir currently being held secondary to elevated LFTs -Continue baricitinib and monitor LFTs closely--LFTs continued to improved -CRP 6.6>>3.1>>1.2>>0.9>>2.2 -D-dimer 1.01>>1.05>>2.63>>6.12>>11.4 -Ferritin>7500>>4189>>2659>>1903>>1340 -Continue vitamin C and zinc -Continue Combivent -CTA chest--neg for PE; bilateral GGO -9/12--lost IV access--had to give am steroid in po form -received IV steroid thereafter -venous duplex legs--neg for DVT -d/c home with prednisone x 6 more days -desaturated with ambulation--d/c home with 4LNC  Gynecomastia -incidental finding on CTA chest -out patient mammogram -patient and mother informed  Tobacco abuse -The patient has approximately 20-pack-year history -Tobacco cessation discussed -continue combivent  Transaminasemia -due to COVID-19 -continue to trend-->stable, trending down -hep B surface antigen--neg -hep C antibody--neg -was not able to give remdesivir  -fortunately, patient gradually improved and became stable for d/c  Anxiety -Started as needed Ativan  Lactic Acidosis  -more due to hypoxia  Class II Obesity -lifestyle modification -higher risk for morbidity and mortality from COVID-19 -BMI 35.94  Discharge Instructions   Allergies as of 09/19/2019      Reactions   Codeine Other (See Comments)   HALLUCINATIONS      Medication List    TAKE these medications   ascorbic acid 500 MG tablet Commonly known as: VITAMIN C Take 1 tablet (500 mg total) by mouth daily. Start taking on: September 20, 2019   predniSONE 50 MG tablet Commonly known as: DELTASONE Take 2 tablets (100 mg total) by mouth 2 (two) times daily with a meal.   zinc sulfate 220 (50 Zn) MG capsule Take 1 capsule (220 mg total) by mouth daily. Start taking on: September 20, 2019  Durable Medical Equipment  (From admission,  onward)         Start     Ordered   09/19/19 1106  For home use only DME oxygen  Once       Question Answer Comment  Length of Need 6 Months   Mode or (Route) Nasal cannula   Liters per Minute 4   Frequency Continuous (stationary and portable oxygen unit needed)   Oxygen conserving device Yes   Oxygen delivery system Gas      09/19/19 1105          Allergies  Allergen Reactions  . Codeine Other (See Comments)    HALLUCINATIONS    Consultations:  none   Procedures/Studies: CT ANGIO CHEST PE W OR WO CONTRAST  Addendum Date: 09/18/2019   ADDENDUM REPORT: 09/18/2019 17:11 ADDENDUM: These results (specifically regarding asymmetric gynecomastia) were called by telephone at the time of interpretation on 09/18/2019 at 4:38pm to provider [name] , who verbally acknowledged these results. Electronically Signed   By: Iven Finn M.D.   On: 09/18/2019 17:11   Result Date: 09/18/2019 CLINICAL DATA:  Shortness of breath and cough, diagnosed with COVID-19 on 09/06/2019. History of former smoker, elevated D dimer, checks x-ray today showing diffuse bilateral patchy and slightly nodular pulmonary opacities consistent with bilateral pneumonia. EXAM: CT ANGIOGRAPHY CHEST WITH CONTRAST TECHNIQUE: Multidetector CT imaging of the chest was performed using the standard protocol during bolus administration of intravenous contrast. Multiplanar CT image reconstructions and MIPs were obtained to evaluate the vascular anatomy. CONTRAST:  151m OMNIPAQUE IOHEXOL 350 MG/ML SOLN COMPARISON:  Chest x-ray 09/15/2019, CT chest 06/28/2014 FINDINGS: Cardiovascular: Satisfactory opacification of the pulmonary arteries to the segmental level. Suggestion of vascular cut offs within the right lower lobe; however, limited evaluation due to timing of contrast. The main pulmonary artery measures at the upper limits of normal (3 cm). No evidence of pulmonary embolism. Normal heart size. No significant pericardial  effusion. The ascending thoracic aorta is stable in caliber measuring to 3.9 cm. The descending thoracic aorta is normal in caliber. No thoracic aorta atherosclerotic plaque. No definite coronary artery calcification. Mediastinum/Nodes: Multiple prominent as well as several mildly enlarged mediastinal lymph nodes with as an example a 1.1 cm right paratracheal lymph node (5:75), and a right precarinal lymph node measuring 1.2 cm (5:89), as well as a 0.9 cm prevascular lymph node (5:89). Several posterior mediastinal lymph nodes are prominent size with ascending example a 0.8 cm (5:20) and 0.9 cm (9:173) periaortic lymph nodes. Bilateral enlarged perihilar lymphadenopathy with as an example a 1.2 cm right (5:131) and a 0.9 cm left (5:119) lymph node. No axillary lymphadenopathy. Lungs/Pleura: Extensive diffuse peribronchovascular and peripheral patchy consolidative opacities. Linear atelectasis versus scarring in the left lower lobe. No definite pulmonary mass. Central airways are patent. Upper Abdomen: No acute abnormality. Musculoskeletal: Interval development of asymmetric right greater than left gynecomastia. No suspicious lytic or blastic osseous lesions. No acute displaced fracture. Multilevel degenerative changes of the spine. Review of the MIP images confirms the above findings. IMPRESSION: 1. No central or segmental pulmonary embolus. Limited evaluation at the subsegmental level due to timing of contrast. 2. Extensive diffuse consolidative peribronchovascular and peripheral consolidations consistent with bilateral pneumonia in a patient with known COVID-19 infection. 3. Likely reactive mediastinal and hilar lymphadenopathy. 4. Interval development of asymmetric right greater than left gynecomastia. Given asymmetry, consider non-emergent mammographic/sonographic evaluation. Electronically Signed: By: MIven FinnM.D. On: 09/18/2019 15:44   [place]ous Img  Lower Bilateral (DVT)  Result Date:  09/19/2019 CLINICAL DATA:  Bilateral lower extremity edema. Elevated D-dimer. Evaluate for DVT. EXAM: BILATERAL LOWER EXTREMITY VENOUS DOPPLER ULTRASOUND TECHNIQUE: Gray-scale sonography with graded compression, as well as color Doppler and duplex ultrasound were performed to evaluate the lower extremity deep venous systems from the level of the common femoral vein and including the common femoral, femoral, profunda femoral, popliteal and calf veins including the posterior tibial, peroneal and gastrocnemius veins when visible. The superficial great saphenous vein was also interrogated. Spectral Doppler was utilized to evaluate flow at rest and with distal augmentation maneuvers in the common femoral, femoral and popliteal veins. COMPARISON:  None. FINDINGS: RIGHT LOWER EXTREMITY Common Femoral Vein: No evidence of thrombus. Normal compressibility, respiratory phasicity and response to augmentation. Saphenofemoral Junction: No evidence of thrombus. Normal compressibility and flow on color Doppler imaging. Profunda Femoral Vein: No evidence of thrombus. Normal compressibility and flow on color Doppler imaging. Femoral Vein: No evidence of thrombus. Normal compressibility, respiratory phasicity and response to augmentation. Popliteal Vein: No evidence of thrombus. Normal compressibility, respiratory phasicity and response to augmentation. Calf Veins: No evidence of thrombus. Normal compressibility and flow on color Doppler imaging. Superficial Great Saphenous Vein: No evidence of thrombus. Normal compressibility. Venous Reflux:  None. Other Findings:  None. LEFT LOWER EXTREMITY Common Femoral Vein: No evidence of thrombus. Normal compressibility, respiratory phasicity and response to augmentation. Saphenofemoral Junction: No evidence of thrombus. Normal compressibility and flow on color Doppler imaging. Profunda Femoral Vein: No evidence of thrombus. Normal compressibility and flow on color Doppler imaging. Femoral  Vein: No evidence of thrombus. Normal compressibility, respiratory phasicity and response to augmentation. Popliteal Vein: No evidence of thrombus. Normal compressibility, respiratory phasicity and response to augmentation. Calf Veins: No evidence of thrombus. Normal compressibility and flow on color Doppler imaging. Superficial Great Saphenous Vein: No evidence of thrombus. Normal compressibility. Venous Reflux:  None. Other Findings:  None. IMPRESSION: No evidence of DVT within either lower extremity. Electronically Signed   By: Sandi Mariscal M.D.   On: 09/19/2019 10:07   DG Chest Portable 1 View  Result Date: 09/15/2019 CLINICAL DATA:  Shortness of breath history of COVID EXAM: PORTABLE CHEST 1 VIEW COMPARISON:  03/07/2013 FINDINGS: Diffuse bilateral patchy and slightly nodular pulmonary opacity. No pleural effusion. Stable cardiomediastinal silhouette. No pneumothorax. IMPRESSION: Diffuse bilateral patchy and slightly nodular pulmonary opacity consistent most likely due to bilateral pneumonia and history of COVID positivity Electronically Signed   By: Donavan Foil M.D.   On: 09/15/2019 15:30   US Abdomen Limited RUQ  Result Date: 09/16/2019 CLINICAL DATA:  Elevated LFTs EXAM: ULTRASOUND ABDOMEN LIMITED RIGHT UPPER QUADRANT COMPARISON:  CT abdomen and pelvis 06/09/2013 FINDINGS: Gallbladder: Normally distended without stones or wall thickening. No pericholecystic fluid or sonographic Murphy sign. Common bile duct: Diameter: 4 mm, normal Liver: Echogenic parenchyma, likely fatty infiltration though this can be seen with cirrhosis and certain infiltrative disorders. No focal hepatic mass or nodularity. Portal vein patent with normal direction of blood flow towards the liver. Other: No RIGHT upper quadrant free fluid. IMPRESSION: Probable fatty infiltration of liver as above. Otherwise negative exam. Electronically Signed   By: Lavonia Dana M.D.   On: 09/16/2019 14:31         Discharge Exam: Vitals:    09/19/19 0538 09/19/19 0738  BP: 120/66   Pulse: 63   Resp: 18   Temp: 98.6 F (37 C)   SpO2: 95% 95%   Vitals:   09/18/19 2048  09/18/19 2148 09/19/19 0538 09/19/19 0738  BP:  (!) 129/96 120/66   Pulse:  71 63   Resp:  18 18   Temp:  97.9 F (36.6 C) 98.6 F (37 C)   TempSrc:  Oral Oral   SpO2: 94% 97% 95% 95%  Weight:      Height:        General: Pt is alert, awake, not in acute distress Cardiovascular: RRR, S1/S2 +, no rubs, no gallops Respiratory: bibasilar rales. No wheeze Abdominal: Soft, NT, ND, bowel sounds + Extremities: non pitting edema, no cyanosis   The results of significant diagnostics from this hospitalization (including imaging, microbiology, ancillary and laboratory) are listed below for reference.    Significant Diagnostic Studies: CT ANGIO CHEST PE W OR WO CONTRAST  Addendum Date: 09/18/2019   ADDENDUM REPORT: 09/18/2019 17:11 ADDENDUM: These results (specifically regarding asymmetric gynecomastia) were called by telephone at the time of interpretation on 09/18/2019 at 4:38pm to provider Antonino Nienhuis , who verbally acknowledged these results. Electronically Signed   By: Iven Finn M.D.   On: 09/18/2019 17:11   Result Date: 09/18/2019 CLINICAL DATA:  Shortness of breath and cough, diagnosed with COVID-19 on 09/06/2019. History of former smoker, elevated D dimer, checks x-ray today showing diffuse bilateral patchy and slightly nodular pulmonary opacities consistent with bilateral pneumonia. EXAM: CT ANGIOGRAPHY CHEST WITH CONTRAST TECHNIQUE: Multidetector CT imaging of the chest was performed using the standard protocol during bolus administration of intravenous contrast. Multiplanar CT image reconstructions and MIPs were obtained to evaluate the vascular anatomy. CONTRAST:  151m OMNIPAQUE IOHEXOL 350 MG/ML SOLN COMPARISON:  Chest x-ray 09/15/2019, CT chest 06/28/2014 FINDINGS: Cardiovascular: Satisfactory opacification of the pulmonary arteries to the  segmental level. Suggestion of vascular cut offs within the right lower lobe; however, limited evaluation due to timing of contrast. The main pulmonary artery measures at the upper limits of normal (3 cm). No evidence of pulmonary embolism. Normal heart size. No significant pericardial effusion. The ascending thoracic aorta is stable in caliber measuring to 3.9 cm. The descending thoracic aorta is normal in caliber. No thoracic aorta atherosclerotic plaque. No definite coronary artery calcification. Mediastinum/Nodes: Multiple prominent as well as several mildly enlarged mediastinal lymph nodes with as an example a 1.1 cm right paratracheal lymph node (5:75), and a right precarinal lymph node measuring 1.2 cm (5:89), as well as a 0.9 cm prevascular lymph node (5:89). Several posterior mediastinal lymph nodes are prominent size with ascending example a 0.8 cm (5:20) and 0.9 cm (9:173) periaortic lymph nodes. Bilateral enlarged perihilar lymphadenopathy with as an example a 1.2 cm right (5:131) and a 0.9 cm left (5:119) lymph node. No axillary lymphadenopathy. Lungs/Pleura: Extensive diffuse peribronchovascular and peripheral patchy consolidative opacities. Linear atelectasis versus scarring in the left lower lobe. No definite pulmonary mass. Central airways are patent. Upper Abdomen: No acute abnormality. Musculoskeletal: Interval development of asymmetric right greater than left gynecomastia. No suspicious lytic or blastic osseous lesions. No acute displaced fracture. Multilevel degenerative changes of the spine. Review of the MIP images confirms the above findings. IMPRESSION: 1. No central or segmental pulmonary embolus. Limited evaluation at the subsegmental level due to timing of contrast. 2. Extensive diffuse consolidative peribronchovascular and peripheral consolidations consistent with bilateral pneumonia in a patient with known COVID-19 infection. 3. Likely reactive mediastinal and hilar lymphadenopathy. 4.  Interval development of asymmetric right greater than left gynecomastia. Given asymmetry, consider non-emergent mammographic/sonographic evaluation. Electronically Signed: By: MIven FinnM.D. On: 09/18/2019 15:44  US Venous Img Lower Bilateral (DVT)  Result Date: 09/19/2019 CLINICAL DATA:  Bilateral lower extremity edema. Elevated D-dimer. Evaluate for DVT. EXAM: BILATERAL LOWER EXTREMITY VENOUS DOPPLER ULTRASOUND TECHNIQUE: Gray-scale sonography with graded compression, as well as color Doppler and duplex ultrasound were performed to evaluate the lower extremity deep venous systems from the level of the common femoral vein and including the common femoral, femoral, profunda femoral, popliteal and calf veins including the posterior tibial, peroneal and gastrocnemius veins when visible. The superficial great saphenous vein was also interrogated. Spectral Doppler was utilized to evaluate flow at rest and with distal augmentation maneuvers in the common femoral, femoral and popliteal veins. COMPARISON:  None. FINDINGS: RIGHT LOWER EXTREMITY Common Femoral Vein: No evidence of thrombus. Normal compressibility, respiratory phasicity and response to augmentation. Saphenofemoral Junction: No evidence of thrombus. Normal compressibility and flow on color Doppler imaging. Profunda Femoral Vein: No evidence of thrombus. Normal compressibility and flow on color Doppler imaging. Femoral Vein: No evidence of thrombus. Normal compressibility, respiratory phasicity and response to augmentation. Popliteal Vein: No evidence of thrombus. Normal compressibility, respiratory phasicity and response to augmentation. Calf Veins: No evidence of thrombus. Normal compressibility and flow on color Doppler imaging. Superficial Great Saphenous Vein: No evidence of thrombus. Normal compressibility. Venous Reflux:  None. Other Findings:  None. LEFT LOWER EXTREMITY Common Femoral Vein: No evidence of thrombus. Normal compressibility,  respiratory phasicity and response to augmentation. Saphenofemoral Junction: No evidence of thrombus. Normal compressibility and flow on color Doppler imaging. Profunda Femoral Vein: No evidence of thrombus. Normal compressibility and flow on color Doppler imaging. Femoral Vein: No evidence of thrombus. Normal compressibility, respiratory phasicity and response to augmentation. Popliteal Vein: No evidence of thrombus. Normal compressibility, respiratory phasicity and response to augmentation. Calf Veins: No evidence of thrombus. Normal compressibility and flow on color Doppler imaging. Superficial Great Saphenous Vein: No evidence of thrombus. Normal compressibility. Venous Reflux:  None. Other Findings:  None. IMPRESSION: No evidence of DVT within either lower extremity. Electronically Signed   By: Sandi Mariscal M.D.   On: 09/19/2019 10:07   DG Chest Portable 1 View  Result Date: 09/15/2019 CLINICAL DATA:  Shortness of breath history of COVID EXAM: PORTABLE CHEST 1 VIEW COMPARISON:  03/07/2013 FINDINGS: Diffuse bilateral patchy and slightly nodular pulmonary opacity. No pleural effusion. Stable cardiomediastinal silhouette. No pneumothorax. IMPRESSION: Diffuse bilateral patchy and slightly nodular pulmonary opacity consistent most likely due to bilateral pneumonia and history of COVID positivity Electronically Signed   By: Donavan Foil M.D.   On: 09/15/2019 15:30   US Abdomen Limited RUQ  Result Date: 09/16/2019 CLINICAL DATA:  Elevated LFTs EXAM: ULTRASOUND ABDOMEN LIMITED RIGHT UPPER QUADRANT COMPARISON:  CT abdomen and pelvis 06/09/2013 FINDINGS: Gallbladder: Normally distended without stones or wall thickening. No pericholecystic fluid or sonographic Murphy sign. Common bile duct: Diameter: 4 mm, normal Liver: Echogenic parenchyma, likely fatty infiltration though this can be seen with cirrhosis and certain infiltrative disorders. No focal hepatic mass or nodularity. Portal vein patent with normal  direction of blood flow towards the liver. Other: No RIGHT upper quadrant free fluid. IMPRESSION: Probable fatty infiltration of liver as above. Otherwise negative exam. Electronically Signed   By: Lavonia Dana M.D.   On: 09/16/2019 14:31     Microbiology: Recent Results (from the past 240 hour(s))  Blood Culture (routine x 2)     Status: None (Preliminary result)   Collection Time: 09/15/19  4:29 PM   Specimen: Right Antecubital; Blood  Result Value Ref Range  Status   Specimen Description RIGHT ANTECUBITAL  Final   Special Requests   Final    BOTTLES DRAWN AEROBIC AND ANAEROBIC Blood Culture adequate volume   Culture   Final    NO GROWTH 3 DAYS Performed at [facility], [address], [place], [place] [address]    Report Status PENDING  Incomplete  Blood Culture (routine x 2)     Status: None (Preliminary result)   Collection Time: 09/15/19  4:29 PM   Specimen: BLOOD RIGHT HAND  Result Value Ref Range Status   Specimen Description BLOOD RIGHT HAND  Final   Special Requests   Final    BOTTLES DRAWN AEROBIC AND ANAEROBIC Blood Culture adequate volume   Culture   Final    NO GROWTH 3 DAYS Performed at [facility], [address] [place], [place] [address]    Report Status PENDING  Incomplete     Labs: Basic Metabolic Panel: Recent Labs  Lab 09/15/19 1626 09/15/19 1626 09/16/19 0721 09/16/19 0721 09/17/19 0736 09/17/19 0736 09/18/19 0724 09/19/19 0641  NA 135  --  136  --  136  --  137 136  K 3.9   < > 4.8   < > 4.8   < > 4.4 4.4  CL 97*  --  101  --  101  --  103 105  CO2 25  --  24  --  26  --  25 23  GLUCOSE 136*  --  151*  --  165*  --  140* 156*  BUN 24*  --  27*  --  28*  --  25* 22*  CREATININE 1.04  --  0.94  --  0.93  --  0.88 0.86  CALCIUM 8.7*  --  8.2*  --  8.3*  --  7.9* 7.8*   < > = values in this interval not displayed.   Liver Function Tests: Recent Labs  Lab 09/15/19 1626 09/16/19 0721 09/17/19 0736 09/18/19 0724 09/19/19 0641  AST 577*  247* 155* 108* 46*  ALT 494* 365* 345* 367* 262*  ALKPHOS 90 86 87 87 83  BILITOT 1.0 0.9 0.9 0.8 0.7  PROT 7.2 6.2* 6.1* 5.3* 5.4*  ALBUMIN 3.5 3.0* 2.9* 2.6* 2.6*   No results for input(s): LIPASE, AMYLASE in the last 168 hours. No results for input(s): AMMONIA in the last 168 hours. CBC: Recent Labs  Lab 09/15/19 1626 09/16/19 0721 09/17/19 0736 09/18/19 0724 09/19/19 0641  WBC 8.3 6.1 11.0* 10.8* 11.2*  NEUTROABS 7.0 4.6 9.5* 9.4* 10.0*  HGB 15.3 13.7 13.6 12.6* 13.0  HCT 45.8 41.9 42.1 38.6* 38.9*  MCV 89.8 91.5 92.3 90.2 90.3  PLT 251 275 300 261 234   Cardiac Enzymes: No results for input(s): CKTOTAL, CKMB, CKMBINDEX, TROPONINI in the last 168 hours. BNP: Invalid input(s): POCBNP CBG: Recent Labs  Lab 09/18/19 1116 09/18/19 1629 09/18/19 2148 09/19/19 0738 09/19/19 1102  GLUCAP 176* 154* 144* 137* 226*    Time coordinating discharge:  36 minutes  Signed:  Orson Eva, DO [facility] Pager: [phone] 09/19/2019, 11:25 AM

## 2019-09-19 NOTE — Progress Notes (Signed)
SATURATION QUALIFICATIONS: (This note is used to comply with regulatory documentation for home oxygen)  Patient Saturations on Room Air at Rest = 95  Patient Saturations on Room Air while Ambulating = 85  Patient Saturations on 4 Liters of oxygen while Ambulating = 94  Please briefly explain why patient needs home oxygen: To maintain 02 sat at 90% or above during ambulation.  Shawn [name]

## 2019-09-19 NOTE — TOC Transition Note (Signed)
Transition of Care [facility]) - CM/SW Discharge Note   Patient Details  Name: Shawn [name] MRN: [id number] Date of Birth: [birth year]  Transition of Care [facility]) CM/SW Contact:  Annice Needy, LCSW Phone Number: 09/19/2019, 12:14 PM   Clinical Narrative:    Oxygen delivered to patient's room.    Final next level of care: Home/Self Care Barriers to Discharge: No Barriers Identified   Patient Goals and CMS Choice Patient states their goals for this hospitalization and ongoing recovery are:: home/return to baseline      Discharge Placement                       Discharge Plan and Services                DME Arranged: Oxygen DME Agency: [employer] Date DME Agency Contacted: 09/19/19 Time DME Agency Contacted: 1214 Representative spoke with at DME Agency: [facility]            Social Determinants of Health (SDOH) Interventions     Readmission Risk Interventions No flowsheet data found.

## 2019-09-20 LAB — CULTURE, BLOOD (ROUTINE X 2)
Culture: NO GROWTH
Culture: NO GROWTH
Special Requests: ADEQUATE
Special Requests: ADEQUATE

## 2019-09-21 DIAGNOSIS — U071 COVID-19: Secondary | ICD-10-CM | POA: Diagnosis not present

## 2019-09-22 ENCOUNTER — Telehealth: Payer: Self-pay | Admitting: Nurse Practitioner

## 2019-09-22 NOTE — Telephone Encounter (Signed)
appt made.  Tresa Endo aware

## 2019-09-26 ENCOUNTER — Telehealth (INDEPENDENT_AMBULATORY_CARE_PROVIDER_SITE_OTHER): Payer: Self-pay | Admitting: Nurse Practitioner

## 2019-09-26 ENCOUNTER — Encounter: Payer: Self-pay | Admitting: Nurse Practitioner

## 2019-09-26 DIAGNOSIS — R0602 Shortness of breath: Secondary | ICD-10-CM

## 2019-09-26 DIAGNOSIS — F410 Panic disorder [episodic paroxysmal anxiety] without agoraphobia: Secondary | ICD-10-CM

## 2019-09-26 MED ORDER — LORAZEPAM 0.5 MG PO TABS: 0.5000 mg | ORAL_TABLET | Freq: Two times a day (BID) | ORAL | 1 refills | Status: DC

## 2019-09-26 NOTE — Progress Notes (Signed)
° °  Virtual Visit via video Note   Due to COVID-19 pandemic this visit was conducted virtually. This visit type was conducted due to national recommendations for restrictions regarding the COVID-19 Pandemic (e.g. social distancing, sheltering in place) in an effort to limit this patient's exposure and mitigate transmission in our community. All issues noted in this document were discussed and addressed.  A physical exam was not performed with this format.  I connected with  Shawn [name]  on 09/26/19 at 10:50 by video and verified that I am speaking with the correct person using two identifiers. Shawn [name] is currently located at home and his wife is currently with him during visit. The provider, Mary-Margaret Hassell Done, FNP is located in their office at time of visit.  I discussed the limitations, risks, security and privacy concerns of performing an evaluation and management service by video  and the availability of in person appointments. I also discussed with the patient that there may be a patient responsible charge related to this service. The patient expressed understanding and agreed to proceed.   History and Present Illness:   Chief Complaint: Covid Positive and Hospitalization Follow-up   HPI Patient does video visit today after being hospitalized for covid. He tested positive for covid on 09/06/19  He continued to worsen and had to go to the hospital on 09/15/19 due to difficulty breathing. He was not a candidate for remdisivir because LFT's were elevated. He required high flow oxygen while in hospital . He was discharged home on O2 at 5l via [place]. He is still SOB which is causing panic attacks and makes brething worse. His current o2 sat is 88%. He says he still feels terrible.  Review of Systems  Constitutional: Positive for malaise/fatigue. Negative for chills and fever.  Respiratory: Positive for cough and shortness of breath.   Neurological: Negative.   Psychiatric/Behavioral:  The patient is nervous/anxious.        Observations/Objective: Alert and oriented- answers all questions appropriately Moderate distress O2 via cannula at 5L- O2 sat 88%   Assessment and Plan: Shawn [name] in today with chief complaint of Covid Positive and Hospitalization Follow-up   1. SOB (shortness of breath) Labs pending to decide on remdesivir - CMP14+EGFR - CBC with Differential/Platelet  2. Panic attack Stress management - LORazepam (ATIVAN) 0.5 MG tablet; Take 1 tablet (0.5 mg total) by mouth 2 (two) times daily.  Dispense: 60 tablet; Refill: 1     Follow Up Instructions: prn    I discussed the assessment and treatment plan with the patient. The patient was provided an opportunity to ask questions and all were answered. The patient agreed with the plan and demonstrated an understanding of the instructions.   The patient was advised to call back or seek an in-person evaluation if the symptoms worsen or if the condition fails to improve as anticipated.  The above assessment and management plan was discussed with the patient. The patient verbalized understanding of and has agreed to the management plan. Patient is aware to call the clinic if symptoms persist or worsen. Patient is aware when to return to the clinic for a follow-up visit. Patient educated on when it is appropriate to go to the emergency department.   Time call ended: 12:05  I provided 39minutes minutes of face-to-face time during this encounter.    Mary-Margaret Hassell Done, FNP

## 2019-09-27 ENCOUNTER — Telehealth: Payer: Self-pay

## 2019-09-27 ENCOUNTER — Inpatient Hospital Stay (HOSPITAL_COMMUNITY)
Admission: EM | Admit: 2019-09-27 | Discharge: 2019-09-30 | DRG: 177 | Disposition: A | Discharge status: Home / Self Care | Payer: BC Managed Care – PPO | Attending: Internal Medicine | Admitting: Internal Medicine

## 2019-09-27 ENCOUNTER — Emergency Department (HOSPITAL_COMMUNITY): Payer: BC Managed Care – PPO

## 2019-09-27 ENCOUNTER — Encounter (HOSPITAL_COMMUNITY): Payer: Self-pay | Admitting: Internal Medicine

## 2019-09-27 ENCOUNTER — Other Ambulatory Visit: Payer: Self-pay

## 2019-09-27 DIAGNOSIS — Z885 Allergy status to narcotic agent status: Secondary | ICD-10-CM | POA: Diagnosis not present

## 2019-09-27 DIAGNOSIS — Z79899 Other long term (current) drug therapy: Secondary | ICD-10-CM | POA: Diagnosis not present

## 2019-09-27 DIAGNOSIS — I2692 Saddle embolus of pulmonary artery without acute cor pulmonale: Secondary | ICD-10-CM | POA: Diagnosis present

## 2019-09-27 DIAGNOSIS — J189 Pneumonia, unspecified organism: Secondary | ICD-10-CM | POA: Diagnosis not present

## 2019-09-27 DIAGNOSIS — Z87891 Personal history of nicotine dependence: Secondary | ICD-10-CM

## 2019-09-27 DIAGNOSIS — Z833 Family history of diabetes mellitus: Secondary | ICD-10-CM

## 2019-09-27 DIAGNOSIS — I2699 Other pulmonary embolism without acute cor pulmonale: Secondary | ICD-10-CM | POA: Diagnosis not present

## 2019-09-27 DIAGNOSIS — Z86711 Personal history of pulmonary embolism: Secondary | ICD-10-CM | POA: Diagnosis present

## 2019-09-27 DIAGNOSIS — J9601 Acute respiratory failure with hypoxia: Secondary | ICD-10-CM | POA: Diagnosis present

## 2019-09-27 DIAGNOSIS — Z820 Family history of epilepsy and other diseases of the nervous system: Secondary | ICD-10-CM

## 2019-09-27 DIAGNOSIS — U071 COVID-19: Principal | ICD-10-CM | POA: Diagnosis present

## 2019-09-27 DIAGNOSIS — F419 Anxiety disorder, unspecified: Secondary | ICD-10-CM | POA: Diagnosis not present

## 2019-09-27 DIAGNOSIS — J1282 Pneumonia due to coronavirus disease 2019: Secondary | ICD-10-CM | POA: Diagnosis not present

## 2019-09-27 DIAGNOSIS — R0602 Shortness of breath: Secondary | ICD-10-CM | POA: Diagnosis not present

## 2019-09-27 LAB — CBC WITH DIFFERENTIAL/PLATELET
Abs Immature Granulocytes: 0.15 10*3/uL — ABNORMAL HIGH (ref 0.00–0.07)
Basophils Absolute: 0 10*3/uL (ref 0.0–0.2)
Basophils Absolute: 0 10*3/uL (ref 0.0–0.1)
Basophils Relative: 0 %
Basos: 0 %
EOS (ABSOLUTE): 0 10*3/uL (ref 0.0–0.4)
Eos: 0 %
Eosinophils Absolute: 0.2 10*3/uL (ref 0.0–0.5)
Eosinophils Relative: 2 %
HCT: 43.9 % (ref 39.0–52.0)
Hematocrit: 46 % (ref 37.5–51.0)
Hemoglobin: 15.3 g/dL (ref 13.0–17.7)
Hemoglobin: 14.6 g/dL (ref 13.0–17.0)
Immature Grans (Abs): 0.2 10*3/uL — ABNORMAL HIGH (ref 0.0–0.1)
Immature Granulocytes: 1 %
Immature Granulocytes: 1 %
Lymphocytes Absolute: 0.9 10*3/uL (ref 0.7–3.1)
Lymphocytes Relative: 8 %
Lymphs Abs: 0.8 10*3/uL (ref 0.7–4.0)
Lymphs: 5 %
MCH: 29.9 pg (ref 26.6–33.0)
MCH: 30 pg (ref 26.0–34.0)
MCHC: 33.3 g/dL (ref 31.5–35.7)
MCHC: 33.3 g/dL (ref 30.0–36.0)
MCV: 90 fL (ref 79–97)
MCV: 90.1 fL (ref 80.0–100.0)
Monocytes Absolute: 1.4 10*3/uL — ABNORMAL HIGH (ref 0.1–0.9)
Monocytes Absolute: 1 10*3/uL (ref 0.1–1.0)
Monocytes Relative: 9 %
Monocytes: 8 %
Neutro Abs: 8.3 10*3/uL — ABNORMAL HIGH (ref 1.7–7.7)
Neutrophils Absolute: 15.3 10*3/uL — ABNORMAL HIGH (ref 1.4–7.0)
Neutrophils Relative %: 80 %
Neutrophils: 86 %
Platelets: 149 10*3/uL — ABNORMAL LOW (ref 150–450)
Platelets: 127 10*3/uL — ABNORMAL LOW (ref 150–400)
RBC: 5.12 x10E6/uL (ref 4.14–5.80)
RBC: 4.87 MIL/uL (ref 4.22–5.81)
RDW: 13.2 % (ref 11.6–15.4)
RDW: 13.2 % (ref 11.5–15.5)
WBC: 17.9 10*3/uL — ABNORMAL HIGH (ref 3.4–10.8)
WBC: 10.4 10*3/uL (ref 4.0–10.5)
nRBC: 0 % (ref 0.0–0.2)

## 2019-09-27 LAB — TRIGLYCERIDES: Triglycerides: 225 mg/dL — ABNORMAL HIGH (ref <150)

## 2019-09-27 LAB — LACTATE DEHYDROGENASE: LDH: 338 U/L — ABNORMAL HIGH (ref 98–192)

## 2019-09-27 LAB — CMP14+EGFR
ALT: 116 IU/L — ABNORMAL HIGH (ref 0–44)
AST: 24 IU/L (ref 0–40)
Albumin/Globulin Ratio: 1.5 (ref 1.2–2.2)
Albumin: 3.5 g/dL — ABNORMAL LOW (ref 4.0–5.0)
Alkaline Phosphatase: 97 IU/L (ref 44–121)
BUN/Creatinine Ratio: 30 — ABNORMAL HIGH (ref 9–20)
BUN: 25 mg/dL — ABNORMAL HIGH (ref 6–24)
Bilirubin Total: 0.5 mg/dL (ref 0.0–1.2)
CO2: 28 mmol/L (ref 20–29)
Calcium: 8.9 mg/dL (ref 8.7–10.2)
Chloride: 98 mmol/L (ref 96–106)
Creatinine, Ser: 0.83 mg/dL (ref 0.76–1.27)
GFR calc Af Amer: 119 mL/min/{1.73_m2} (ref >59)
GFR calc non Af Amer: 103 mL/min/{1.73_m2} (ref >59)
Globulin, Total: 2.3 g/dL (ref 1.5–4.5)
Glucose: 89 mg/dL (ref 65–99)
Potassium: 4.2 mmol/L (ref 3.5–5.2)
Sodium: 138 mmol/L (ref 134–144)
Total Protein: 5.8 g/dL — ABNORMAL LOW (ref 6.0–8.5)

## 2019-09-27 LAB — COMPREHENSIVE METABOLIC PANEL
ALT: 123 U/L — ABNORMAL HIGH (ref 0–44)
AST: 42 U/L — ABNORMAL HIGH (ref 15–41)
Albumin: 2.7 g/dL — ABNORMAL LOW (ref 3.5–5.0)
Alkaline Phosphatase: 69 U/L (ref 38–126)
Anion gap: 10 (ref 5–15)
BUN: 30 mg/dL — ABNORMAL HIGH (ref 6–20)
CO2: 26 mmol/L (ref 22–32)
Calcium: 8.2 mg/dL — ABNORMAL LOW (ref 8.9–10.3)
Chloride: 101 mmol/L (ref 98–111)
Creatinine, Ser: 1.11 mg/dL (ref 0.61–1.24)
GFR calc Af Amer: >60 mL/min (ref >60)
GFR calc non Af Amer: >60 mL/min (ref >60)
Glucose, Bld: 74 mg/dL (ref 70–99)
Potassium: 4.1 mmol/L (ref 3.5–5.1)
Sodium: 137 mmol/L (ref 135–145)
Total Bilirubin: 0.8 mg/dL (ref 0.3–1.2)
Total Protein: 5.6 g/dL — ABNORMAL LOW (ref 6.5–8.1)

## 2019-09-27 LAB — C-REACTIVE PROTEIN: CRP: 3.2 mg/dL — ABNORMAL HIGH (ref <1.0)

## 2019-09-27 LAB — TROPONIN I (HIGH SENSITIVITY)
Troponin I (High Sensitivity): 8 ng/L (ref <18)
Troponin I (High Sensitivity): 7 ng/L (ref <18)

## 2019-09-27 LAB — D-DIMER, QUANTITATIVE: D-Dimer, Quant: >20 ug/mL-FEU — ABNORMAL HIGH (ref 0.00–0.50)

## 2019-09-27 LAB — LACTIC ACID, PLASMA
Lactic Acid, Venous: 1.1 mmol/L (ref 0.5–1.9)
Lactic Acid, Venous: 1 mmol/L (ref 0.5–1.9)

## 2019-09-27 LAB — PROCALCITONIN: Procalcitonin: <0.1 ng/mL

## 2019-09-27 LAB — FERRITIN: Ferritin: 1184 ng/mL — ABNORMAL HIGH (ref 24–336)

## 2019-09-27 LAB — FIBRINOGEN: Fibrinogen: 247 mg/dL (ref 210–475)

## 2019-09-27 MED ORDER — ONDANSETRON HCL 4 MG PO TABS: 4.0000 mg | ORAL_TABLET | Freq: Four times a day (QID) | ORAL | Status: DC | PRN

## 2019-09-27 MED ORDER — LORAZEPAM 2 MG/ML IJ SOLN
0.5000 mg | Freq: Once | INTRAMUSCULAR | Status: AC
  Administered 2019-09-27: 0.5 mg via INTRAVENOUS
  Filled 2019-09-27: qty 1

## 2019-09-27 MED ORDER — HEPARIN BOLUS VIA INFUSION
5000.0000 [IU] | Freq: Once | INTRAVENOUS | Status: AC
  Administered 2019-09-27: 5000 [IU] via INTRAVENOUS
  Filled 2019-09-27: qty 5000

## 2019-09-27 MED ORDER — POLYETHYLENE GLYCOL 3350 17 G PO PACK
17.0000 g | PACK | Freq: Every day | ORAL | Status: DC | PRN
  Administered 2019-09-29: 17 g via ORAL
  Filled 2019-09-27: qty 1

## 2019-09-27 MED ORDER — AZITHROMYCIN 250 MG PO TABS: ORAL_TABLET | ORAL | 0 refills | Status: DC

## 2019-09-27 MED ORDER — ACETAMINOPHEN 650 MG RE SUPP: 650.0000 mg | Freq: Four times a day (QID) | RECTAL | Status: DC | PRN

## 2019-09-27 MED ORDER — HEPARIN (PORCINE) 25000 UT/250ML-% IV SOLN
1700.0000 [IU]/h | INTRAVENOUS | Status: DC
  Administered 2019-09-27: 1700 [IU]/h via INTRAVENOUS
  Filled 2019-09-27: qty 250

## 2019-09-27 MED ORDER — IOHEXOL 350 MG/ML SOLN
100.0000 mL | Freq: Once | INTRAVENOUS | Status: AC | PRN
  Administered 2019-09-27: 100 mL via INTRAVENOUS

## 2019-09-27 MED ORDER — SODIUM CHLORIDE 0.9 % IV SOLN
1000.0000 mL | INTRAVENOUS | Status: DC
  Administered 2019-09-27 – 2019-09-28 (×2): 1000 mL via INTRAVENOUS

## 2019-09-27 MED ORDER — ACETAMINOPHEN 325 MG PO TABS
650.0000 mg | ORAL_TABLET | Freq: Four times a day (QID) | ORAL | Status: DC | PRN
  Administered 2019-09-29: 650 mg via ORAL
  Filled 2019-09-27: qty 2

## 2019-09-27 MED ORDER — ONDANSETRON HCL 4 MG/2ML IJ SOLN: 4.0000 mg | Freq: Four times a day (QID) | INTRAMUSCULAR | Status: DC | PRN

## 2019-09-27 NOTE — ED Notes (Signed)
Patient given sandwich and water

## 2019-09-27 NOTE — ED Provider Notes (Signed)
[facility]-EMERGENCY DEPT Provider Note   CSN: [id number] Arrival date & time: 09/27/19  1648     History Chief Complaint  Patient presents with  . Abnormal Lab    Shawn [name] is a 49 y.o. male.  Pt presents to the ED today with sob and weakness.  The pt has been suffering from Covid since his diagnosis on 8/31.  He was unvaccinated.  He was hospitalized for Covid pneumonia from 9/10 to 9/14.  The pt was d/c home with 4- 5 L oxygen.  The pt had 1 dose of remdesivir in the ED on the 10th, but did not receive any more doses due to elevated LFTs.  The pt was started on Baricitinib and IV steroids.  He has continued to feel terrible.  He has followed up with his pcp who has rechecked his LFTs.  They are back down.  She tried to get him into the infusion center for Remdesivir, but he was told to come to the ED to monitor his LFTs.  Pt is still very sob and has a cough.        History reviewed. No pertinent past medical history.  Patient Active Problem List   Diagnosis Date Noted  . Acute saddle pulmonary embolism without acute cor pulmonale (HCC) 09/27/2019  . Positive D dimer   . Tobacco abuse 09/16/2019  . Obesity, Class II, BMI 35-39.9 09/16/2019  . Acute respiratory failure with hypoxia (HCC) 09/15/2019  . Elevated LFTs 09/15/2019  . Pneumonia due to COVID-19 virus 09/15/2019    History reviewed. No pertinent surgical history.     Family History  Problem Relation Age of Onset  . Diabetes Maternal Grandmother   . Alzheimer's disease Paternal Grandmother   . Cancer Paternal Grandfather        THROAT    Social History   Tobacco Use  . Smoking status: Former Smoker    Packs/day: 0.25    Years: 15.00    Pack years: 3.75  . Smokeless tobacco: Never Used  Substance Use Topics  . Alcohol use: Yes  . Drug use: No    Home Medications Prior to Admission medications   Medication Sig Start Date End Date Taking? Authorizing Provider  ascorbic  acid (VITAMIN C) 500 MG tablet Take 1 tablet (500 mg total) by mouth daily. 09/20/19   Catarina Hartshornat, David, MD  azithromycin (ZITHROMAX Z-PAK) 250 MG tablet As directed 09/27/19   Daphine DeutscherMartin, Mary-Margaret, FNP  LORazepam (ATIVAN) 0.5 MG tablet Take 1 tablet (0.5 mg total) by mouth 2 (two) times daily. 09/26/19   Daphine DeutscherMartin, Mary-Margaret, FNP  predniSONE (DELTASONE) 50 MG tablet Take 2 tablets (100 mg total) by mouth 2 (two) times daily with a meal. 09/19/19   Tat, Onalee Huaavid, MD  zinc sulfate 220 (50 Zn) MG capsule Take 1 capsule (220 mg total) by mouth daily. 09/20/19   Catarina Hartshornat, David, MD    Allergies    Codeine  Review of Systems   Review of Systems  Respiratory: Positive for cough and shortness of breath.   All other systems reviewed and are negative.   Physical Exam Updated Vital Signs BP 111/77   Pulse 80   Temp 99 F (37.2 C) (Oral)   Resp (!) 22   Ht 5\' 11"  (1.803 m)   Wt 115.7 kg   SpO2 100%   BMI 35.57 kg/m   Physical Exam Vitals and nursing note reviewed.  Constitutional:      General: He is in acute distress.  Appearance: He is ill-appearing.  HENT:     Head: Normocephalic and atraumatic.     Right Ear: External ear normal.     Left Ear: External ear normal.     Nose: Nose normal.     Mouth/Throat:     Mouth: Mucous membranes are moist.     Pharynx: Oropharynx is clear.  Eyes:     Extraocular Movements: Extraocular movements intact.     Conjunctiva/sclera: Conjunctivae normal.     Pupils: Pupils are equal, round, and reactive to light.  Cardiovascular:     Rate and Rhythm: Normal rate and regular rhythm.     Pulses: Normal pulses.     Heart sounds: Normal heart sounds.  Pulmonary:     Effort: Tachypnea present.     Breath sounds: Rhonchi present.  Abdominal:     General: Abdomen is flat. Bowel sounds are normal.     Palpations: Abdomen is soft.  Musculoskeletal:        General: Normal range of motion.     Cervical back: Normal range of motion and neck supple.  Skin:     General: Skin is warm.     Capillary Refill: Capillary refill takes less than 2 seconds.  Neurological:     General: No focal deficit present.     Mental Status: He is alert and oriented to person, place, and time.  Psychiatric:        Mood and Affect: Mood normal.        Behavior: Behavior normal.        Thought Content: Thought content normal.        Judgment: Judgment normal.     ED Results / Procedures / Treatments   Labs (all labs ordered are listed, but only abnormal results are displayed) Labs Reviewed  CBC WITH DIFFERENTIAL/PLATELET - Abnormal; Notable for the following components:      Result Value   Platelets 127 (*)    Neutro Abs 8.3 (*)    Abs Immature Granulocytes 0.15 (*)    All other components within normal limits  COMPREHENSIVE METABOLIC PANEL - Abnormal; Notable for the following components:   BUN 30 (*)    Calcium 8.2 (*)    Total Protein 5.6 (*)    Albumin 2.7 (*)    AST 42 (*)    ALT 123 (*)    All other components within normal limits  D-DIMER, QUANTITATIVE (NOT AT [facility]) - Abnormal; Notable for the following components:   D-Dimer, Quant >20.00 (*)    All other components within normal limits  LACTATE DEHYDROGENASE - Abnormal; Notable for the following components:   LDH 338 (*)    All other components within normal limits  FERRITIN - Abnormal; Notable for the following components:   Ferritin 1,184 (*)    All other components within normal limits  TRIGLYCERIDES - Abnormal; Notable for the following components:   Triglycerides 225 (*)    All other components within normal limits  C-REACTIVE PROTEIN - Abnormal; Notable for the following components:   CRP 3.2 (*)    All other components within normal limits  CULTURE, BLOOD (ROUTINE X 2)  CULTURE, BLOOD (ROUTINE X 2)  LACTIC ACID, PLASMA  LACTIC ACID, PLASMA  PROCALCITONIN  FIBRINOGEN  CBC  HEPARIN LEVEL (UNFRACTIONATED)  TROPONIN I (HIGH SENSITIVITY)  TROPONIN I (HIGH SENSITIVITY)    EKG EKG  Interpretation  Date/Time:  Wednesday September 27 2019 17:35:21 EDT Ventricular Rate:  80 PR Interval:  QRS Duration: 84 QT Interval:  361 QTC Calculation: 417 R Axis:   -15 Text Interpretation: Sinus rhythm Borderline left axis deviation Low voltage, precordial leads No significant change since last tracing Confirmed by Jacalyn Lefevre [phone]) on 09/27/2019 5:39:18 PM   Radiology CT Angio Chest PE W and/or Wo Contrast  Result Date: 09/27/2019 CLINICAL DATA:  Positive COVID test, shortness of breath, elevated D-dimer pulmonary embolus high probability suspected. EXAM: CT ANGIOGRAPHY CHEST WITH CONTRAST TECHNIQUE: Multidetector CT imaging of the chest was performed using the standard protocol during bolus administration of intravenous contrast. Multiplanar CT image reconstructions and MIPs were obtained to evaluate the vascular anatomy. CONTRAST:  OMNIPAQUE IOHEXOL 350 MG/ML SOLN COMPARISON:  Chest x-ray 09/27/2019. FINDINGS: Cardiovascular: Satisfactory opacification of the pulmonary arteries to the segmental level. Saddle embolus of the main pulmonary arteries that extend to the segmental levels in all lobes and likely to the subsegmental level. Limited evaluation at the subsegmental level due to timing of contrast. The main pulmonary artery is normal in caliber. Normal heart size. No significant pericardial effusion. No paradoxical bowing or straightening of the interventricular septum. Normal ventricular ratio. No retrograde reflux of contrast within the inferior vena cava are hepatic veins. The thoracic aorta is normal in caliber. Mediastinum/Nodes: Several enlarged mediastinal lymph nodes with as an example a 1 cm right paratracheal lymph node (6:92) and a 1.3 cm a precarinal lymph node (6:106). Prominent right hilar lymph node. No definite left hilar lymphadenopathy. No enlarged axillary lymph nodes. Thyroid gland, trachea, and esophagus demonstrate no significant findings. Lungs/Pleura:  Diffuse peribronchovascular and peripheral consolidative opacities with areas of subpleural sparing. Limited evaluation for pulmonary infarction in the setting of such diffuse disease. Possible trace right pleural effusion. No left pleural effusion. No pneumothorax. Upper Abdomen: No acute abnormality. Musculoskeletal: No chest wall abnormality Diffusely decreased bone density. No suspicious lytic or blastic osseous lesions. No acute displaced fracture. Multilevel degenerative changes of the spine. Review of the MIP images confirms the above findings. IMPRESSION: 1. Saddle embolus with no associated findings of right heart strain. No definite pulmonary infarction; however, please note the markedly limited evaluation for pulmonary infarction in the setting of diffuse COVID-19 pneumonia. 2. Extensive COVID-19 pneumonia. These results were called by telephone at the time of interpretation on 09/27/2019 at 8:26 pm to provider [facility] , who verbally acknowledged these results. Electronically Signed   By: Tish Frederickson M.D.   On: 09/27/2019 20:32   DG Chest Port 1 View  Result Date: 09/27/2019 CLINICAL DATA:  Hypoxia, shortness of breath, presumptive COVID 19 EXAM: PORTABLE CHEST 1 VIEW COMPARISON:  09/15/2019 FINDINGS: Single frontal view of the chest demonstrates a stable cardiac silhouette. Lung volumes are diminished, with slight progression of multifocal bilateral airspace disease. No effusion or pneumothorax. IMPRESSION: 1. Decreased lung volumes, with progression of bilateral multifocal pneumonia. Electronically Signed   By: Sharlet Salina M.D.   On: 09/27/2019 18:08    Procedures Procedures (including critical care time)  Medications Ordered in ED Medications  0.9 %  sodium chloride infusion (1,000 mLs Intravenous New Bag/Given 09/27/19 1816)  heparin ADULT infusion 100 units/mL (25000 units/249mL sodium chloride 0.45%) (1,700 Units/hr Intravenous New Bag/Given 09/27/19 2036)  iohexol  (OMNIPAQUE) 350 MG/ML injection 100 mL (100 mLs Intravenous Contrast Given 09/27/19 2013)  heparin bolus via infusion 5,000 Units (5,000 Units Intravenous Bolus from Bag 09/27/19 2035)  LORazepam (ATIVAN) injection 0.5 mg (0.5 mg Intravenous Given 09/27/19 2118)    ED Course  I have  reviewed the triage vital signs and the nursing notes.  Pertinent labs & imaging results that were available during my care of the patient were reviewed by me and considered in my medical decision making (see chart for details).    MDM Rules/Calculators/A&P                          Remdesivir d/w pharmacy.  They do not think it will have any benefit at this stage and could make him worse.  CT scan shows a saddle pulmonary embolus.  The pt started on heparin.  Pt has no evidence of heart strain.  Pt d/w CCM who feel pt can be admitted by the hospitalists as he is oxygenating on oxygen via [place].  Pt d/w Dr. Leafy Half (triad) for admission.  CRITICAL CARE Performed by: Jacalyn Lefevre   Total critical care time: 30 minutes  Critical care time was exclusive of separately billable procedures and treating other patients.  Critical care was necessary to treat or prevent imminent or life-threatening deterioration.  Critical care was time spent personally by me on the following activities: development of treatment plan with patient and/or surrogate as well as nursing, discussions with consultants, evaluation of patient's response to treatment, examination of patient, obtaining history from patient or surrogate, ordering and performing treatments and interventions, ordering and review of laboratory studies, ordering and review of radiographic studies, pulse oximetry and re-evaluation of patient's condition.  Shahan Starks was evaluated in Emergency Department on 09/27/2019 for the symptoms described in the history of present illness. He was evaluated in the context of the global COVID-19 pandemic, which necessitated  consideration that the patient might be at risk for infection with the SARS-CoV-2 virus that causes COVID-19. Institutional protocols and algorithms that pertain to the evaluation of patients at risk for COVID-19 are in a state of rapid change based on information released by regulatory bodies including the CDC and federal and state organizations. These policies and algorithms were followed during the patient's care in the ED.  Final Clinical Impression(s) / ED Diagnoses Final diagnoses:  Acute saddle pulmonary embolism without acute cor pulmonale (HCC)  Pneumonia due to COVID-19 virus    Rx / DC Orders ED Discharge Orders    None       Jacalyn Lefevre, MD 09/27/19 2143

## 2019-09-27 NOTE — Telephone Encounter (Signed)
Wife states that ativan is making pt very ill and angry. Wants to know if there is anything else they can do or take? Please advise

## 2019-09-27 NOTE — Addendum Note (Signed)
Addended by: Bennie Pierini on: 09/27/2019 10:37 AM   Modules accepted: Orders

## 2019-09-27 NOTE — H&P (Addendum)
History and Physical    Shawn [name] PFX:[id number] DOB: Nov 09, 1970 DOA: 09/27/2019  PCP: Bennie Pierini, FNP  Patient coming from:    Chief Complaint:   Shortness of breath  HPI:   49 year old male with past medical history of obesity, recent diagnosis of COVID-19 infection requiring hospitalization and was in the hospital for 9/10 to 9/14 presenting with complaints of shortness of breath and chest pain.  Patient explains that since his discharge from [facility] on 9/10, he initially slowly began to feel better.  Patient was with ongoing oxygen therapy, 4 L of oxygen via nasal cannula.  Patient splints however that 2 days ago, he suddenly began to develop dramatic worsening in symptoms.  He states that he began to develop rapidly worsening shortness of breath with associated severe chest pain.  Patient describes the chest pain as anterior in location, nonradiating, sharp in quality and severe in intensity.  Patient states that in the days preceding this change in symptoms he noticed that his legs have become swollen somewhat.  Patient denies fevers, nausea, vomiting.  Patient is associated with lack of taste and lack of appetite have improved somewhat since discharge.  Due to patient's progressively worsening symptoms, patient discussed his case with his primary care provider earlier today who sent him to the emergency department for a remdesivir infusion.  Upon evaluation in the emergency department, patient was found to be in respiratory distress.  Patient was found to have a markedly elevated D-dimer of greater than 20 followed up by a CT angiogram of the chest revealing a saddle pulmonary embolus without right heart strain.  Additionally, patient was found to have extensive bilateral infiltrates consistent with persisting Covid infection.  Patient is initiated on a heparin infusion by the emergency department provider.  Provider additionally discussed case with critical  care where it was recommended that patient be admitted to the hospital service and to the stepdown unit and that the patient did not require thrombolytics at this time.  Hospitalist group was then called to assess the patient for mission to the hospital.  Review of Systems:   Review of Systems  Constitutional: Positive for malaise/fatigue.  Respiratory: Positive for cough, hemoptysis and shortness of breath.   Cardiovascular: Positive for leg swelling.  All other systems reviewed and are negative.   History reviewed. No pertinent past medical history.  History reviewed. No pertinent surgical history.   reports that he has quit smoking. He has a 3.75 pack-year smoking history. He has never used smokeless tobacco. He reports current alcohol use. He reports that he does not use drugs.  Allergies  Allergen Reactions  . Codeine Other (See Comments)    HALLUCINATIONS    Family History  Problem Relation Age of Onset  . Diabetes Maternal Grandmother   . Alzheimer's disease Paternal Grandmother   . Cancer Paternal Grandfather        THROAT     Prior to Admission medications   Medication Sig Start Date End Date Taking? Authorizing Provider  ascorbic acid (VITAMIN C) 500 MG tablet Take 1 tablet (500 mg total) by mouth daily. 09/20/19  Yes Tat, Onalee Hua, MD  LORazepam (ATIVAN) 0.5 MG tablet Take 1 tablet (0.5 mg total) by mouth 2 (two) times daily. 09/26/19  Yes Martin, Mary-Margaret, FNP  zinc sulfate 220 (50 Zn) MG capsule Take 1 capsule (220 mg total) by mouth daily. 09/20/19  Yes TatOnalee Hua, MD  azithromycin (ZITHROMAX Z-PAK) 250 MG tablet As directed 09/27/19   Daphine Deutscher,  Mary-Margaret, FNP  predniSONE (DELTASONE) 50 MG tablet Take 2 tablets (100 mg total) by mouth 2 (two) times daily with a meal. Patient not taking: Reported on 09/27/2019 09/19/19   Catarina Hartshorn, MD    Physical Exam: Vitals:   09/27/19 1718 09/27/19 1719 09/27/19 2040 09/27/19 2238  BP: 103/68  111/77 116/76  Pulse: 81  80  91  Resp: (!) 31  (!) 22 (!) 23  Temp: 99 F (37.2 C)     TempSrc: Oral     SpO2: 97%  100% 97%  Weight:  115.7 kg    Height:  5\' 11"  (1.803 m)      Constitutional: Acute alert and oriented x3, patient is in mild respiratory distress. Skin: no rashes, no lesions, good skin turgor noted. Eyes: Pupils are equally reactive to light.  No evidence of scleral icterus or conjunctival pallor.  ENMT: Moist mucous membranes noted.  Posterior pharynx clear of any exudate or lesions.   Neck: normal, supple, no masses, no thyromegaly.  No evidence of jugular venous distension.   Respiratory: Notable bibasilar and mid field rales.  No evidence of associated wheezing.  Scattered rhonchi bilaterally.  Diminished breath sounds at the bases.   Cardiovascular: Regular rate and rhythm, no murmurs / rubs / gallops.  Trace bilateral lower extremity pitting edema.  2+ pedal pulses. No carotid bruits.  Chest:   Nontender without crepitus or deformity.   Back:   Nontender without crepitus or deformity. Abdomen: Diffuse tenderness.  Abdomen is soft however.   No evidence of intra-abdominal masses.  Positive bowel sounds noted in all quadrants.   Musculoskeletal: Notable tenderness of the distal bilateral lower extremities.  Good ROM, no contractures. Normal muscle tone.  Neurologic: CN 2-12 grossly intact. Sensation intact.  Patient moving all 4 extremities spontaneously.  Patient is following all commands.  Patient is responsive to verbal stimuli.   Psychiatric: Patient exhibits normal mood with appropriate affect.  Patient seems to possess insight as to their current situation.     Labs on Admission: I have personally reviewed following labs and imaging studies -   CBC: Recent Labs  Lab 09/26/19 1445 09/27/19 1814  WBC 17.9* 10.4  NEUTROABS 15.3* 8.3*  HGB 15.3 14.6  HCT 46.0 43.9  MCV 90 90.1  PLT 149* 127*   Basic Metabolic Panel: Recent Labs  Lab 09/26/19 1445 09/27/19 1814  NA 138 137  K 4.2  4.1  CL 98 101  CO2 28 26  GLUCOSE 89 74  BUN 25* 30*  CREATININE 0.83 1.11  CALCIUM 8.9 8.2*   GFR: Estimated Creatinine Clearance: 104.2 mL/min (by C-G formula based on SCr of 1.11 mg/dL). Liver Function Tests: Recent Labs  Lab 09/26/19 1445 09/27/19 1814  AST 24 42*  ALT 116* 123*  ALKPHOS 97 69  BILITOT 0.5 0.8  PROT 5.8* 5.6*  ALBUMIN 3.5* 2.7*   No results for input(s): LIPASE, AMYLASE in the last 168 hours. No results for input(s): AMMONIA in the last 168 hours. Coagulation Profile: No results for input(s): INR, PROTIME in the last 168 hours. Cardiac Enzymes: No results for input(s): CKTOTAL, CKMB, CKMBINDEX, TROPONINI in the last 168 hours. BNP (last 3 results) No results for input(s): PROBNP in the last 8760 hours. HbA1C: No results for input(s): HGBA1C in the last 72 hours. CBG: No results for input(s): GLUCAP in the last 168 hours. Lipid Profile: Recent Labs    09/27/19 1814  TRIG 225*   Thyroid Function Tests: No results  for input(s): TSH, T4TOTAL, FREET4, T3FREE, THYROIDAB in the last 72 hours. Anemia Panel: Recent Labs    09/27/19 1814  FERRITIN 1,184*   Urine analysis:    Component Value Date/Time   BILIRUBINUR negative 06/07/2013 1405   PROTEINUR trace 06/07/2013 1405   UROBILINOGEN negative 06/07/2013 1405   NITRITE negative 06/07/2013 1405   LEUKOCYTESUR Negative 06/07/2013 1405    Radiological Exams on Admission - Personally Reviewed: CT Angio Chest PE W and/or Wo Contrast  Result Date: 09/27/2019 CLINICAL DATA:  Positive COVID test, shortness of breath, elevated D-dimer pulmonary embolus high probability suspected. EXAM: CT ANGIOGRAPHY CHEST WITH CONTRAST TECHNIQUE: Multidetector CT imaging of the chest was performed using the standard protocol during bolus administration of intravenous contrast. Multiplanar CT image reconstructions and MIPs were obtained to evaluate the vascular anatomy. CONTRAST:  OMNIPAQUE IOHEXOL 350 MG/ML  SOLN COMPARISON:  Chest x-ray 09/27/2019. FINDINGS: Cardiovascular: Satisfactory opacification of the pulmonary arteries to the segmental level. Saddle embolus of the main pulmonary arteries that extend to the segmental levels in all lobes and likely to the subsegmental level. Limited evaluation at the subsegmental level due to timing of contrast. The main pulmonary artery is normal in caliber. Normal heart size. No significant pericardial effusion. No paradoxical bowing or straightening of the interventricular septum. Normal ventricular ratio. No retrograde reflux of contrast within the inferior vena cava are hepatic veins. The thoracic aorta is normal in caliber. Mediastinum/Nodes: Several enlarged mediastinal lymph nodes with as an example a 1 cm right paratracheal lymph node (6:92) and a 1.3 cm a precarinal lymph node (6:106). Prominent right hilar lymph node. No definite left hilar lymphadenopathy. No enlarged axillary lymph nodes. Thyroid gland, trachea, and esophagus demonstrate no significant findings. Lungs/Pleura: Diffuse peribronchovascular and peripheral consolidative opacities with areas of subpleural sparing. Limited evaluation for pulmonary infarction in the setting of such diffuse disease. Possible trace right pleural effusion. No left pleural effusion. No pneumothorax. Upper Abdomen: No acute abnormality. Musculoskeletal: No chest wall abnormality Diffusely decreased bone density. No suspicious lytic or blastic osseous lesions. No acute displaced fracture. Multilevel degenerative changes of the spine. Review of the MIP images confirms the above findings. IMPRESSION: 1. Saddle embolus with no associated findings of right heart strain. No definite pulmonary infarction; however, please note the markedly limited evaluation for pulmonary infarction in the setting of diffuse COVID-19 pneumonia. 2. Extensive COVID-19 pneumonia. These results were called by telephone at the time of interpretation on  09/27/2019 at 8:26 pm to provider [facility] , who verbally acknowledged these results. Electronically Signed   By: Tish Frederickson M.D.   On: 09/27/2019 20:32   DG Chest Port 1 View  Result Date: 09/27/2019 CLINICAL DATA:  Hypoxia, shortness of breath, presumptive COVID 19 EXAM: PORTABLE CHEST 1 VIEW COMPARISON:  09/15/2019 FINDINGS: Single frontal view of the chest demonstrates a stable cardiac silhouette. Lung volumes are diminished, with slight progression of multifocal bilateral airspace disease. No effusion or pneumothorax. IMPRESSION: 1. Decreased lung volumes, with progression of bilateral multifocal pneumonia. Electronically Signed   By: Sharlet Salina M.D.   On: 09/27/2019 18:08    EKG: Personally reviewed.  Rhythm is normal sinus rhythm with heart rate of 80 bpm.  Left axis deviation noted.  No dynamic ST segment changes appreciated.  Assessment/Plan Principal Problem:   Acute saddle pulmonary embolism without acute cor pulmonale (HCC)   Patient presenting with 2-day history of progressively worsening shortness of breath, sharp chest pains and hemoptysis.   Upon evaluation here,  patient found to have markedly elevated D-dimer and evidence of saddle pulmonary embolism on CT angiogram of the chest without right heart strain.  No dynamic EKG changes noted, troponin noted to be unremarkable  Case already discussed with critical care by the emergency department provider -it is felt the patient is not a candidate for thrombolytic therapy or ICU admission.  Admitting patient to stepdown unit  Monitoring patient on telemetry  Heparin infusion has been initiated which will be continued for now and eventually transitioned to oral anticoagulation once deemed appropriate  Echocardiogram in the morning  Providing supplemental oxygen (patient was already requiring 4 L of oxygen via nasal cannula at time of discharge on 9/14.  Active Problems:   Acute respiratory failure with hypoxia  (HCC)   Ongoing, secondary to extensive COVID-19 infection with superimposed saddle pulmonary embolus  Continue to provide supplemental oxygen to target oxygen saturations of 92 to 94%  Remainder of assessment and plan as above.    COVID-19 virus infection    Recent diagnosis of COVID-19 infection 22 days ago at this point  According to current guidance, patient would typically have isolation discontinued after 21 days.  However, patient has continued to exhibit increasing oxygen requirements, likely multifactorial secondary to development of pulmonary embolism and and sequela of COVID-19 infection.  We will therefore will leave isolated for now  Day provider can review case with infectious disease to determine if isolation is no longer indicated at their discretion.   Code Status:  Full code Family Communication: deferred   Status is: Inpatient  Remains inpatient appropriate because:IV treatments appropriate due to intensity of illness or inability to take PO and Inpatient level of care appropriate due to severity of illness   Dispo: The patient is from: Home              Anticipated d/c is to: Home              Anticipated d/c date is: > 3 days              Patient currently is not medically stable to d/c.        Marinda ElkGeorge J Valentine Barney MD [facility] Pager [phone]  If 7PM-7AM, please contact night-coverage [email or web address] Use universal [place] password for that web site. If you do not have the password, please call the hospital operator.  09/27/2019, 11:24 PM

## 2019-09-27 NOTE — Progress Notes (Addendum)
ANTICOAGULATION CONSULT NOTE - Initial Consult  Pharmacy Consult for Heparin  Indication: pulmonary embolus  Allergies  Allergen Reactions  . Codeine Other (See Comments)    HALLUCINATIONS    Patient Measurements: Height: 5\' 11"  (180.3 cm) Weight: 115.7 kg (255 lb) IBW/kg (Calculated) : 75.3 Heparin Dosing Weight: 100 kg  Vital Signs: Temp: 99 F (37.2 C) (09/22 1718) Temp Source: Oral (09/22 1718) BP: 103/68 (09/22 1718) Pulse Rate: 81 (09/22 1718)  Labs: Recent Labs    09/26/19 1445 09/27/19 1814  HGB 15.3 14.6  HCT 46.0 43.9  PLT 149* 127*  CREATININE 0.83 1.11    Estimated Creatinine Clearance: 104.2 mL/min (by C-G formula based on SCr of 1.11 mg/dL).   Medical History: No past medical history on file.  Assessment: 49 y/o M recently hospitalized from 9/10-9/14 with COVID PNA admited with ShOB and weakness. Patient was told to come to the ED to monitor LFTs for remdesivir as was unable to receive except for one dose previously due to elevated LFTs, however was initially  diagnosed on August 31 so remdesivir would not be prudent at this point and could potentially exacerbate liver insult. In the ED, d-dimer was > 20 and CT revealed PE. Pharmacy consulted for heparin infusion.   Goal of Therapy:  Heparin level 0.3-0.7 units/ml Monitor platelets by anticoagulation protocol: Yes   Plan:  Give 5000 units bolus x 1 Start heparin infusion at 1700 units/hr Check anti-Xa level in 6 hours and daily while on heparin Continue to monitor H&H and platelets  September 02 D 09/27/2019,8:22 PM

## 2019-09-27 NOTE — ED Triage Notes (Signed)
Pt POV from home-  Reports PCP sent him here for "an infusion because my labs for my liver aren't good."  Pt Arrives on 4L O2 via [place] on home O2 supply, switched to hospital O2 supply.  Pt unable to speak in full sentences during triage.   EDP [place] at bedside.

## 2019-09-27 NOTE — Telephone Encounter (Signed)
Received a call back from the infusion center about getting patient set up. I had left a voicemail yesterday regarding a possible infusion but concerned because LFTs were elevated. Patient had labwork repeated in office yesterday and I relayed those results to scheduling nurse at infusion center. She advised that she would discuss with the NP on staff and contact me back. Received call at later time from infusion center and they advised that patient would need to have infusion done at the hospital so they could monitor labs. I contacted the patient and advised pts significant other that he should go to the emergency room and gave her all instructions as to what to expect. She states that they would try and go this evening. Advised if they need any further assistance to give our office a call. Patient verbalized understanding.

## 2019-09-28 ENCOUNTER — Inpatient Hospital Stay (HOSPITAL_COMMUNITY): Payer: BC Managed Care – PPO

## 2019-09-28 DIAGNOSIS — I2692 Saddle embolus of pulmonary artery without acute cor pulmonale: Secondary | ICD-10-CM

## 2019-09-28 LAB — CBC WITH DIFFERENTIAL/PLATELET
Abs Immature Granulocytes: 0.11 10*3/uL — ABNORMAL HIGH (ref 0.00–0.07)
Basophils Absolute: 0 10*3/uL (ref 0.0–0.1)
Basophils Relative: 0 %
Eosinophils Absolute: 0.2 10*3/uL (ref 0.0–0.5)
Eosinophils Relative: 3 %
HCT: 39.2 % (ref 39.0–52.0)
Hemoglobin: 12.9 g/dL — ABNORMAL LOW (ref 13.0–17.0)
Immature Granulocytes: 1 %
Lymphocytes Relative: 9 %
Lymphs Abs: 0.8 10*3/uL (ref 0.7–4.0)
MCH: 30.3 pg (ref 26.0–34.0)
MCHC: 32.9 g/dL (ref 30.0–36.0)
MCV: 92 fL (ref 80.0–100.0)
Monocytes Absolute: 0.7 10*3/uL (ref 0.1–1.0)
Monocytes Relative: 9 %
Neutro Abs: 6.3 10*3/uL (ref 1.7–7.7)
Neutrophils Relative %: 78 %
Platelets: 106 10*3/uL — ABNORMAL LOW (ref 150–400)
RBC: 4.26 MIL/uL (ref 4.22–5.81)
RDW: 13.4 % (ref 11.5–15.5)
WBC: 8.1 10*3/uL (ref 4.0–10.5)
nRBC: 0 % (ref 0.0–0.2)

## 2019-09-28 LAB — COMPREHENSIVE METABOLIC PANEL
ALT: 98 U/L — ABNORMAL HIGH (ref 0–44)
AST: 27 U/L (ref 15–41)
Albumin: 2.5 g/dL — ABNORMAL LOW (ref 3.5–5.0)
Alkaline Phosphatase: 61 U/L (ref 38–126)
Anion gap: 9 (ref 5–15)
BUN: 28 mg/dL — ABNORMAL HIGH (ref 6–20)
CO2: 30 mmol/L (ref 22–32)
Calcium: 8.2 mg/dL — ABNORMAL LOW (ref 8.9–10.3)
Chloride: 101 mmol/L (ref 98–111)
Creatinine, Ser: 1.12 mg/dL (ref 0.61–1.24)
GFR calc Af Amer: >60 mL/min (ref >60)
GFR calc non Af Amer: >60 mL/min (ref >60)
Glucose, Bld: 123 mg/dL — ABNORMAL HIGH (ref 70–99)
Potassium: 4 mmol/L (ref 3.5–5.1)
Sodium: 140 mmol/L (ref 135–145)
Total Bilirubin: 0.7 mg/dL (ref 0.3–1.2)
Total Protein: 4.9 g/dL — ABNORMAL LOW (ref 6.5–8.1)

## 2019-09-28 LAB — ECHOCARDIOGRAM COMPLETE
Area-P 1/2: 3.37 cm2
Height: 71 in
S' Lateral: 2.6 cm
Weight: 4080 oz

## 2019-09-28 LAB — LACTATE DEHYDROGENASE: LDH: 278 U/L — ABNORMAL HIGH (ref 98–192)

## 2019-09-28 LAB — HEPARIN LEVEL (UNFRACTIONATED)
Heparin Unfractionated: 0.84 IU/mL — ABNORMAL HIGH (ref 0.30–0.70)
Heparin Unfractionated: 0.75 IU/mL — ABNORMAL HIGH (ref 0.30–0.70)
Heparin Unfractionated: 0.48 IU/mL (ref 0.30–0.70)

## 2019-09-28 LAB — MAGNESIUM: Magnesium: 2.2 mg/dL (ref 1.7–2.4)

## 2019-09-28 LAB — D-DIMER, QUANTITATIVE: D-Dimer, Quant: >20 ug/mL-FEU — ABNORMAL HIGH (ref 0.00–0.50)

## 2019-09-28 LAB — FERRITIN: Ferritin: 723 ng/mL — ABNORMAL HIGH (ref 24–336)

## 2019-09-28 MED ORDER — LORAZEPAM 0.5 MG PO TABS
0.5000 mg | ORAL_TABLET | Freq: Two times a day (BID) | ORAL | Status: DC
  Administered 2019-09-28: 0.5 mg via ORAL
  Filled 2019-09-28: qty 1

## 2019-09-28 MED ORDER — PHENOL 1.4 % MT LIQD
1.0000 | OROMUCOSAL | Status: DC | PRN
  Administered 2019-09-28: 1 via OROMUCOSAL
  Filled 2019-09-28: qty 177

## 2019-09-28 MED ORDER — OXYCODONE-ACETAMINOPHEN 5-325 MG PO TABS: 1.0000 | ORAL_TABLET | Freq: Four times a day (QID) | ORAL | Status: DC | PRN

## 2019-09-28 MED ORDER — HEPARIN (PORCINE) 25000 UT/250ML-% IV SOLN
1500.0000 [IU]/h | INTRAVENOUS | Status: DC
  Filled 2019-09-28: qty 250

## 2019-09-28 MED ORDER — HEPARIN (PORCINE) 25000 UT/250ML-% IV SOLN
1400.0000 [IU]/h | INTRAVENOUS | Status: AC
  Administered 2019-09-28 – 2019-09-30 (×3): 1400 [IU]/h via INTRAVENOUS
  Filled 2019-09-28 (×2): qty 250

## 2019-09-28 MED ORDER — CLONAZEPAM 0.5 MG PO TABS: 0.5000 mg | ORAL_TABLET | Freq: Two times a day (BID) | ORAL | 1 refills | Status: DC | PRN

## 2019-09-28 MED ORDER — CLONAZEPAM 0.125 MG PO TBDP
0.2500 mg | ORAL_TABLET | Freq: Once | ORAL | Status: AC
  Administered 2019-09-28: 0.25 mg via ORAL
  Filled 2019-09-28: qty 2

## 2019-09-28 NOTE — Progress Notes (Signed)
ANTICOAGULATION CONSULT NOTE -  Consult  Pharmacy Consult for Heparin  Indication: pulmonary embolus  Allergies  Allergen Reactions  . Codeine Other (See Comments)    HALLUCINATIONS    Patient Measurements: Height: 5\' 11"  (180.3 cm) Weight: 115.7 kg (255 lb) IBW/kg (Calculated) : 75.3 Heparin Dosing Weight: 100 kg  Vital Signs: Temp: 99 F (37.2 C) (09/22 1718) Temp Source: Oral (09/22 1718) BP: 114/62 (09/23 0300) Pulse Rate: 94 (09/23 0300)  Labs: Recent Labs    09/26/19 1445 09/27/19 1814 09/27/19 2238 09/28/19 0230 09/28/19 0316  HGB 15.3 14.6  --   --   --   HCT 46.0 43.9  --   --   --   PLT 149* 127*  --   --   --   HEPARINUNFRC  --   --   --  0.84*  --   CREATININE 0.83 1.11  --   --  1.12  TROPONINIHS  --  8 7  --   --     Estimated Creatinine Clearance: 103.3 mL/min (by C-G formula based on SCr of 1.12 mg/dL).   Medical History: History reviewed. No pertinent past medical history.  Assessment: 49 y/o M recently hospitalized from 9/10-9/14 with COVID PNA admited with ShOB and weakness. Patient was told to come to the ED to monitor LFTs for remdesivir as was unable to receive except for one dose previously due to elevated LFTs, however was initially  diagnosed on August 31 so remdesivir would not be prudent at this point and could potentially exacerbate liver insult. In the ED, d-dimer was > 20 and CT revealed PE. Pharmacy consulted for heparin infusion.    Today, 09/28/19  Hgb 12.9, plt 106, low  HL is 0.84, supratherapeutic  No line or bleeding issues per RN   Goal of Therapy:  Heparin level 0.3-0.7 units/ml Monitor platelets by anticoagulation protocol: Yes   Plan:   Decrease  heparin infusion to 1500 units/hr  Check anti-Xa level in 6 hours and daily while on heparin  Continue to monitor H&H and platelets    09/30/19, PharmD, BCPS 09/28/2019 5:09 AM

## 2019-09-28 NOTE — Progress Notes (Signed)
  Echocardiogram 2D Echocardiogram has been performed.  Shawn [name] 09/28/2019, 11:26 AM

## 2019-09-28 NOTE — ED Notes (Signed)
Wife stated "ativan makes him evil"

## 2019-09-28 NOTE — Progress Notes (Signed)
ANTICOAGULATION CONSULT NOTE  Pharmacy Consult for Heparin  Indication: pulmonary embolus  Allergies  Allergen Reactions  . Codeine Other (See Comments)    HALLUCINATIONS    Patient Measurements: Height: 5\' 11"  (180.3 cm) Weight: 115.7 kg (255 lb) IBW/kg (Calculated) : 75.3 Heparin Dosing Weight: 100 kg  Vital Signs: BP: 110/58 (09/23 1002) Pulse Rate: 87 (09/23 1002)  Labs: Recent Labs    09/26/19 1445 09/27/19 1814 09/27/19 2238 09/28/19 0230 09/28/19 0316 09/28/19 1020  HGB 15.3 14.6  --   --  12.9*  --   HCT 46.0 43.9  --   --  39.2  --   PLT 149* 127*  --   --  106*  --   HEPARINUNFRC  --   --   --  0.84*  --  0.75*  CREATININE 0.83 1.11  --   --  1.12  --   TROPONINIHS  --  8 7  --   --   --     Estimated Creatinine Clearance: 103.3 mL/min (by C-G formula based on SCr of 1.12 mg/dL).   Medical History: History reviewed. No pertinent past medical history.  Assessment: 49 y/o M recently hospitalized from 9/10-9/14 with COVID PNA admited with ShOB and weakness. Patient was told to come to the ED to monitor LFTs for remdesivir as was unable to receive except for one dose previously due to elevated LFTs, however was initially  diagnosed on August 31 so remdesivir would not be prudent at this point and could potentially exacerbate liver insult. In the ED, d-dimer was > 20 and CT revealed PE. Pharmacy consulted for heparin infusion.    Today, 09/28/19:  Heparin level = 0.75 units/mL, remains slightly supratherapeutic  CBC: Hgb decreased to 12.9, Pltc decreased to 106K  No bleeding or infusion issues noted per nursing   Goal of Therapy:  Heparin level 0.3-0.7 units/ml Monitor platelets by anticoagulation protocol: Yes   Plan:   Decrease  heparin infusion to 1400 units/hr  Check heparin level 6 hours after rate change  Daily CBC, heparin level  Monitor closely for s/sx of bleeding  09/30/19, PharmD, BCPS 09/28/2019 11:17 AM

## 2019-09-28 NOTE — ED Notes (Signed)
Patient called 911-explained to patient staff was caring for a critical patient and apologized for the delay-patient began using profanity stating he "didn't give a f*$%"

## 2019-09-28 NOTE — Progress Notes (Signed)
Pharmacy - IV heparin  Assessment:    Please see note from Greer Pickerel, PharmD earlier today for full details.  Briefly, 49 y.o. male on IV heparin for new PE   Most recent heparin level now therapeutic on 1400 units/hr  No bleeding or infusion issues per RN  Plan:   Continue heparin at 1400 units/hr  Recheck 6 hr confirmatory level  Bernadene Person, PharmD, BCPS [phone] 09/28/2019, 7:31 PM

## 2019-09-28 NOTE — Plan of Care (Signed)

## 2019-09-28 NOTE — Progress Notes (Signed)
1500 After knocking on the patient's door, this tech entered the room to find the patient mid phone conversation. The patient's voice was raised, and he was using profanity, expressing his desire to leave the hospital. This tech left the room and informed a nurse as to what was said. The patient is refusing testing. Will call hospitalist to inform.   09/28/19 3:10 PM Olen Cordial RVT

## 2019-09-28 NOTE — ED Notes (Signed)
Report given to Joe, RN

## 2019-09-28 NOTE — Telephone Encounter (Signed)
Changed ativan to klonopin bid

## 2019-09-28 NOTE — ED Notes (Addendum)
Patient stated that he is leaving, when explained that his saddle PE's are very dangerous and he could die, he stated, "at least I will die at home with my dog." And called this RN a 'rude asshole.' Patient encouraged to stay, he stated he is putting on his shoes and 'calling a fucking cab.' He had called out while this RN and ED tech were in another room, unable to respond to call light immediately, this was explained to the patient.

## 2019-09-28 NOTE — Progress Notes (Signed)
PROGRESS NOTE    Torion Hulgan  OEU:[id number] DOB: 08-07-1970 DOA: 09/27/2019 PCP: Bennie Pierini, FNP     Brief Narrative:  Shawn [name] is a 49 year old male with past medical history of obesity, recent diagnosis of COVID-19 infection requiring hospitalization and was in the hospital for 9/10 to 9/14 presenting with complaints of shortness of breath and chest pain.  Patient explains that since his discharge from [facility] on 9/14, he initially slowly began to feel better.  During that hospitalization, he received Solu-Medrol as well as baricitinib.  He was discharged home on 4 L nasal cannula O2.  He returned back to the hospital due to chest pain as well as worsening shortness of breath.  Work-up revealed saddle pulmonary embolism without right heart strain.  Case was discussed with critical care, he did not require thrombolytics at this time.  New events last 24 hours / Subjective: Patient states that he continues to have shortness of breath, denies nausea, vomiting, diarrhea   Assessment & Plan:   Principal Problem:   Acute saddle pulmonary embolism without acute cor pulmonale (HCC) Active Problems:   Acute respiratory failure with hypoxia (HCC)   COVID-19 virus infection   Acute hypoxemic respiratory failure secondary to PE, COVID-19 -Patient was discharged home recently on 9/14 on 4 L nasal cannula O2 -Currently remains on 4 L nasal cannula O2  Acute saddle PE -CTA chest: Saddle embolus with no associated findings of right heart strain. No definite pulmonary infarction; however, please note the markedly limited evaluation for pulmonary infarction in the setting of diffuse COVID-19 pneumonia. Extensive COVID-19 pneumonia. -Case was discussed with critical care, was not felt to be a candidate for thrombolytic therapy -Continue IV heparin -Echocardiogram, DVT ultrasound pending  COVID-19 -Tested positive on 8/31 -Patient received baricitinib, steroids  during his hospitalization earlier this month.  Remdesivir held due to elevated LFTs. -Due to ongoing coughing, respiratory failure, CT chest findings, we will continue on isolation precaution at this time  Anxiety -Continue home ativan     DVT prophylaxis: IV heparin   Code Status: Full code Family Communication: Discussed with father over the phone Disposition Plan:  Status is: Inpatient  Remains inpatient appropriate because:Inpatient level of care appropriate due to severity of illness   Dispo: The patient is from: Home              Anticipated d/c is to: Home              Anticipated d/c date is: 2 days              Patient currently is not medically stable to d/c.  Remains inpatient for IV heparin currently for saddle PE.   Consultants:   None  Procedures:   None  Antimicrobials:  Anti-infectives (From admission, onward)   None        Objective: Vitals:   09/28/19 0730 09/28/19 0745 09/28/19 0800 09/28/19 1002  BP:   (!) 139/94 (!) 110/58  Pulse: 81 82 80 87  Resp:   20 (!) 23  Temp:      TempSrc:      SpO2: 97% 99% 100% 100%  Weight:      Height:        Intake/Output Summary (Last 24 hours) at 09/28/2019 1054 Last data filed at 09/28/2019 0400 Gross per 24 hour  Intake 1045.92 ml  Output --  Net 1045.92 ml   Filed Weights   09/27/19 1719  Weight: 115.7 kg  Examination:  General exam: Appears calm, ill and fatigued appearing  Respiratory system: Clear to auscultation. Respiratory effort normal. No respiratory distress. No conversational dyspnea. On 4L [place] O2  Cardiovascular system: S1 & S2 heard, RRR. No murmurs. No pedal edema. Gastrointestinal system: Abdomen is nondistended, soft and nontender. Normal bowel sounds heard. Central nervous system: Alert and oriented. No focal neurological deficits. Speech clear.  Extremities: Symmetric in appearance  Skin: No rashes, lesions or ulcers on exposed skin  Psychiatry: Judgement and insight  appear normal. Mood & affect appropriate.   Data Reviewed: I have personally reviewed following labs and imaging studies  CBC: Recent Labs  Lab 09/26/19 1445 09/27/19 1814 09/28/19 0316  WBC 17.9* 10.4 8.1  NEUTROABS 15.3* 8.3* 6.3  HGB 15.3 14.6 12.9*  HCT 46.0 43.9 39.2  MCV 90 90.1 92.0  PLT 149* 127* 106*   Basic Metabolic Panel: Recent Labs  Lab 09/26/19 1445 09/27/19 1814 09/28/19 0316  NA 138 137 140  K 4.2 4.1 4.0  CL 98 101 101  CO2 28 26 30   GLUCOSE 89 74 123*  BUN 25* 30* 28*  CREATININE 0.83 1.11 1.12  CALCIUM 8.9 8.2* 8.2*  MG  --   --  2.2   GFR: Estimated Creatinine Clearance: 103.3 mL/min (by C-G formula based on SCr of 1.12 mg/dL). Liver Function Tests: Recent Labs  Lab 09/26/19 1445 09/27/19 1814 09/28/19 0316  AST 24 42* 27  ALT 116* 123* 98*  ALKPHOS 97 69 61  BILITOT 0.5 0.8 0.7  PROT 5.8* 5.6* 4.9*  ALBUMIN 3.5* 2.7* 2.5*   No results for input(s): LIPASE, AMYLASE in the last 168 hours. No results for input(s): AMMONIA in the last 168 hours. Coagulation Profile: No results for input(s): INR, PROTIME in the last 168 hours. Cardiac Enzymes: No results for input(s): CKTOTAL, CKMB, CKMBINDEX, TROPONINI in the last 168 hours. BNP (last 3 results) No results for input(s): PROBNP in the last 8760 hours. HbA1C: No results for input(s): HGBA1C in the last 72 hours. CBG: No results for input(s): GLUCAP in the last 168 hours. Lipid Profile: Recent Labs    09/27/19 1814  TRIG 225*   Thyroid Function Tests: No results for input(s): TSH, T4TOTAL, FREET4, T3FREE, THYROIDAB in the last 72 hours. Anemia Panel: Recent Labs    09/27/19 1814 09/28/19 0316  FERRITIN 1,184* 723*   Sepsis Labs: Recent Labs  Lab 09/27/19 1814 09/27/19 2056  PROCALCITON <0.10  --   LATICACIDVEN 1.1 1.0    Recent Results (from the past 240 hour(s))  Blood Culture (routine x 2)     Status: None (Preliminary result)   Collection Time: 09/27/19  6:14 PM     Specimen: BLOOD  Result Value Ref Range Status   Specimen Description   Final    BLOOD SITE NOT SPECIFIED Performed at [facility], [address] [place], [place]    Special Requests   Final    BOTTLES DRAWN AEROBIC AND ANAEROBIC Blood Culture adequate volume Performed at [facility], [address] [place], [place]    Culture PENDING  Incomplete   Report Status PENDING  Incomplete      Radiology Studies: CT Angio Chest PE W and/or Wo Contrast  Result Date: 09/27/2019 CLINICAL DATA:  Positive COVID test, shortness of breath, elevated D-dimer pulmonary embolus high probability suspected. EXAM: CT ANGIOGRAPHY CHEST WITH CONTRAST TECHNIQUE: Multidetector CT imaging of the chest was performed using the standard protocol during  bolus administration of intravenous contrast. Multiplanar CT image reconstructions and MIPs were obtained to evaluate the vascular anatomy. CONTRAST:  OMNIPAQUE IOHEXOL 350 MG/ML SOLN COMPARISON:  Chest x-ray 09/27/2019. FINDINGS: Cardiovascular: Satisfactory opacification of the pulmonary arteries to the segmental level. Saddle embolus of the main pulmonary arteries that extend to the segmental levels in all lobes and likely to the subsegmental level. Limited evaluation at the subsegmental level due to timing of contrast. The main pulmonary artery is normal in caliber. Normal heart size. No significant pericardial effusion. No paradoxical bowing or straightening of the interventricular septum. Normal ventricular ratio. No retrograde reflux of contrast within the inferior vena cava are hepatic veins. The thoracic aorta is normal in caliber. Mediastinum/Nodes: Several enlarged mediastinal lymph nodes with as an example a 1 cm right paratracheal lymph node (6:92) and a 1.3 cm a precarinal lymph node (6:106). Prominent right hilar lymph node. No definite left hilar lymphadenopathy. No enlarged axillary lymph nodes. Thyroid  gland, trachea, and esophagus demonstrate no significant findings. Lungs/Pleura: Diffuse peribronchovascular and peripheral consolidative opacities with areas of subpleural sparing. Limited evaluation for pulmonary infarction in the setting of such diffuse disease. Possible trace right pleural effusion. No left pleural effusion. No pneumothorax. Upper Abdomen: No acute abnormality. Musculoskeletal: No chest wall abnormality Diffusely decreased bone density. No suspicious lytic or blastic osseous lesions. No acute displaced fracture. Multilevel degenerative changes of the spine. Review of the MIP images confirms the above findings. IMPRESSION: 1. Saddle embolus with no associated findings of right heart strain. No definite pulmonary infarction; however, please note the markedly limited evaluation for pulmonary infarction in the setting of diffuse COVID-19 pneumonia. 2. Extensive COVID-19 pneumonia. These results were called by telephone at the time of interpretation on 09/27/2019 at 8:26 pm to provider [facility] , who verbally acknowledged these results. Electronically Signed   By: Tish Frederickson M.D.   On: 09/27/2019 20:32   DG Chest Port 1 View  Result Date: 09/27/2019 CLINICAL DATA:  Hypoxia, shortness of breath, presumptive COVID 19 EXAM: PORTABLE CHEST 1 VIEW COMPARISON:  09/15/2019 FINDINGS: Single frontal view of the chest demonstrates a stable cardiac silhouette. Lung volumes are diminished, with slight progression of multifocal bilateral airspace disease. No effusion or pneumothorax. IMPRESSION: 1. Decreased lung volumes, with progression of bilateral multifocal pneumonia. Electronically Signed   By: Sharlet Salina M.D.   On: 09/27/2019 18:08      Scheduled Meds: . LORazepam  0.5 mg Oral BID   Continuous Infusions: . heparin 1,500 Units/hr (09/28/19 0434)     LOS: 1 day      Time spent: 45 minutes   Noralee Stain, DO [facility] 09/28/2019, 10:54 AM   Available via Epic  secure chat 7am-7pm After these hours, please refer to coverage provider listed on [email or web address]

## 2019-09-28 NOTE — ED Notes (Signed)
Patient stated th

## 2019-09-28 NOTE — Telephone Encounter (Signed)
Pt's wife aware of provider feedback and voiced understanding. 

## 2019-09-29 ENCOUNTER — Encounter (HOSPITAL_COMMUNITY): Payer: BC Managed Care – PPO

## 2019-09-29 LAB — COMPREHENSIVE METABOLIC PANEL
ALT: 80 U/L — ABNORMAL HIGH (ref 0–44)
AST: 24 U/L (ref 15–41)
Albumin: 2.4 g/dL — ABNORMAL LOW (ref 3.5–5.0)
Alkaline Phosphatase: 62 U/L (ref 38–126)
Anion gap: 9 (ref 5–15)
BUN: 20 mg/dL (ref 6–20)
CO2: 24 mmol/L (ref 22–32)
Calcium: 7.8 mg/dL — ABNORMAL LOW (ref 8.9–10.3)
Chloride: 101 mmol/L (ref 98–111)
Creatinine, Ser: 0.88 mg/dL (ref 0.61–1.24)
GFR calc Af Amer: >60 mL/min (ref >60)
GFR calc non Af Amer: >60 mL/min (ref >60)
Glucose, Bld: 98 mg/dL (ref 70–99)
Potassium: 4 mmol/L (ref 3.5–5.1)
Sodium: 134 mmol/L — ABNORMAL LOW (ref 135–145)
Total Bilirubin: 0.8 mg/dL (ref 0.3–1.2)
Total Protein: 5.1 g/dL — ABNORMAL LOW (ref 6.5–8.1)

## 2019-09-29 LAB — CBC
HCT: 38.4 % — ABNORMAL LOW (ref 39.0–52.0)
Hemoglobin: 12.7 g/dL — ABNORMAL LOW (ref 13.0–17.0)
MCH: 30.7 pg (ref 26.0–34.0)
MCHC: 33.1 g/dL (ref 30.0–36.0)
MCV: 92.8 fL (ref 80.0–100.0)
Platelets: 104 10*3/uL — ABNORMAL LOW (ref 150–400)
RBC: 4.14 MIL/uL — ABNORMAL LOW (ref 4.22–5.81)
RDW: 13.2 % (ref 11.5–15.5)
WBC: 6.9 10*3/uL (ref 4.0–10.5)
nRBC: 0 % (ref 0.0–0.2)

## 2019-09-29 LAB — HEPARIN LEVEL (UNFRACTIONATED)
Heparin Unfractionated: 0.55 IU/mL (ref 0.30–0.70)
Heparin Unfractionated: 0.66 IU/mL (ref 0.30–0.70)

## 2019-09-29 LAB — C-REACTIVE PROTEIN: CRP: 5.5 mg/dL — ABNORMAL HIGH (ref <1.0)

## 2019-09-29 LAB — D-DIMER, QUANTITATIVE: D-Dimer, Quant: 15.08 ug/mL-FEU — ABNORMAL HIGH (ref 0.00–0.50)

## 2019-09-29 MED ORDER — CHLORHEXIDINE GLUCONATE 0.12 % MT SOLN
15.0000 mL | Freq: Two times a day (BID) | OROMUCOSAL | Status: DC
  Administered 2019-09-29 – 2019-09-30 (×3): 15 mL via OROMUCOSAL
  Filled 2019-09-29 (×3): qty 15

## 2019-09-29 MED ORDER — ENSURE ENLIVE PO LIQD: 237.0000 mL | Freq: Two times a day (BID) | ORAL | Status: DC

## 2019-09-29 MED ORDER — CLONAZEPAM 0.125 MG PO TBDP
0.2500 mg | ORAL_TABLET | Freq: Once | ORAL | Status: AC
  Administered 2019-09-29: 0.25 mg via ORAL
  Filled 2019-09-29: qty 2

## 2019-09-29 MED ORDER — ORAL CARE MOUTH RINSE
15.0000 mL | Freq: Two times a day (BID) | OROMUCOSAL | Status: DC
  Administered 2019-09-29 – 2019-09-30 (×3): 15 mL via OROMUCOSAL

## 2019-09-29 NOTE — Progress Notes (Addendum)
ANTICOAGULATION CONSULT NOTE  Pharmacy Consult for Heparin  Indication: pulmonary embolus  Allergies  Allergen Reactions  . Codeine Other (See Comments)    HALLUCINATIONS    Patient Measurements: Height: 5\' 11"  (180.3 cm) Weight: 115.7 kg (255 lb) IBW/kg (Calculated) : 75.3 Heparin Dosing Weight: 100 kg  Vital Signs: Temp: 97.9 F (36.6 C) (09/23 2226) Temp Source: Oral (09/23 2226) BP: 100/69 (09/23 2226) Pulse Rate: 88 (09/23 2226)  Labs: Recent Labs     0000 09/27/19 1814 09/27/19 2238 09/28/19 0230 09/28/19 0316 09/28/19 1020 09/28/19 1752 09/29/19 0020  HGB   < > 14.6  --   --  12.9*  --   --  12.7*  HCT  --  43.9  --   --  39.2  --   --  38.4*  PLT  --  127*  --   --  106*  --   --  104*  HEPARINUNFRC  --   --   --    < >  --  0.75* 0.48 0.55  CREATININE  --  1.11  --   --  1.12  --   --  0.88  TROPONINIHS  --  8 7  --   --   --   --   --    < > = values in this interval not displayed.    Estimated Creatinine Clearance: 131.4 mL/min (by C-G formula based on SCr of 0.88 mg/dL).   Medical History: History reviewed. No pertinent past medical history.  Assessment: 49 y/o M recently hospitalized from 9/10-9/14 with COVID PNA admited with ShOB and weakness. Patient was told to come to the ED to monitor LFTs for remdesivir as was unable to receive except for one dose previously due to elevated LFTs, however was initially  diagnosed on August 31 so remdesivir would not be prudent at this point and could potentially exacerbate liver insult. In the ED, d-dimer was > 20 and CT revealed PE. Pharmacy consulted for heparin infusion.    Today, 09/29/19:  Heparin level = 0.55 units/mL, therapeutic  CBC: Hgb stable at  12.7, Pltc decreased to 104K  No bleeding or infusion issues noted per nursing   Goal of Therapy:  Heparin level 0.3-0.7 units/ml Monitor platelets by anticoagulation protocol: Yes   Plan:   Continue heparin infusion to 1400 units/hr  Daily  CBC, heparin level  Monitor closely for s/sx of bleeding   10/01/19, PharmD, BCPS 09/29/2019 3:26 AM    Repeat HL with AM lab is therapeutic at 0.66  No line or bleeding issues per RN   Obtain daily HL   10/01/2019, PharmD, BCPS 09/29/2019 6:37 AM

## 2019-09-29 NOTE — TOC Progression Note (Signed)
Transition of Care [facility]) - Progression Note    Patient Details  Name: Shawn [name] MRN: [id number] Date of Birth: 05-05-1970  Transition of Care [facility]) CM/SW Contact  Geni Bers, RN Phone Number: 09/29/2019, 2:23 PM  Clinical Narrative:     TOC will follow for discharge needs.   Expected Discharge Plan: Home/Self Care Barriers to Discharge: No Barriers Identified  Expected Discharge Plan and Services Expected Discharge Plan: Home/Self Care                                               Social Determinants of Health (SDOH) Interventions    Readmission Risk Interventions No flowsheet data found.

## 2019-09-29 NOTE — Progress Notes (Signed)
Initial Nutrition Assessment  DOCUMENTATION CODES:   Obesity unspecified  INTERVENTION:   -Ensure Enlive po BID, each supplement provides 350 kcal and 20 grams of protein  NUTRITION DIAGNOSIS:   Increased nutrient needs related to acute illness (COVID-19 infection) as evidenced by estimated needs.  GOAL:   Patient will meet greater than or equal to 90% of their needs  MONITOR:   PO intake, Supplement acceptance, Labs, Weight trends, I & O's  REASON FOR ASSESSMENT:   Malnutrition Screening Tool    ASSESSMENT:   49 year old male with past medical history of obesity, recent diagnosis of COVID-19 infection requiring hospitalization and was in the hospital for 9/10 to 9/14 presenting with complaints of shortness of breath and chest pain.  Patient has been positive for COVID-19 since 8/31. Pt still with active symptoms. Pt was having issues with taste and appetite earlier after diagnosis but this is improving.  Pt is eating well today, 100% of meals at this time. Given increased needs from active COVID-19 infection, will order Ensure supplements.  Per weight records, pt has lost 9 lbs since 9/10 (3% wt loss x 14 days, significant for time frame).  Medications reviewed. Labs reviewed: Low Na  NUTRITION - FOCUSED PHYSICAL EXAM:  Unable to complete  Diet Order:   Diet Order            Diet regular Room service appropriate? Yes with Assist; Fluid consistency: Thin  Diet effective now                 EDUCATION NEEDS:   No education needs have been identified at this time  Skin:  Skin Assessment: Reviewed RN Assessment  Last BM:  PTA  Height:   Ht Readings from Last 1 Encounters:  09/27/19 5\' 11"  (1.803 m)    Weight:   Wt Readings from Last 1 Encounters:  09/27/19 115.7 kg    BMI:  Body mass index is 35.57 kg/m.  Estimated Nutritional Needs:   Kcal:  2200-2400  Protein:  105-120g  Fluid:  2L/day  09/29/19, MS, RD, LDN Inpatient Clinical  Dietitian Contact information available via Amion

## 2019-09-29 NOTE — Progress Notes (Signed)
PROGRESS NOTE    Ramey Schiff  JXB:[id number] DOB: 02/16/1970 DOA: 09/27/2019 PCP: Bennie Pierini, FNP     Brief Narrative:  Shawn [name] is a 49 year old male with past medical history of obesity, recent diagnosis of COVID-19 infection requiring hospitalization and was in the hospital for 9/10 to 9/14 presenting with complaints of shortness of breath and chest pain.  Patient explains that since his discharge from [facility] on 9/14, he initially slowly began to feel better.  During that hospitalization, he received Solu-Medrol as well as baricitinib.  He was discharged home on 4 L nasal cannula O2.  He returned back to the hospital due to chest pain as well as worsening shortness of breath.  Work-up revealed saddle pulmonary embolism without right heart strain.  Case was discussed with critical care, he did not require thrombolytics at this time.  New events last 24 hours / Subjective: States that he feels a little bit better today, wants to go home.  Assessment & Plan:   Principal Problem:   Acute saddle pulmonary embolism without acute cor pulmonale (HCC) Active Problems:   Acute respiratory failure with hypoxia (HCC)   COVID-19 virus infection   Acute hypoxemic respiratory failure secondary to PE, COVID-19 -Patient was discharged home recently on 9/14 on 4 L nasal cannula O2 -Currently remains on 6 L nasal cannula O2, continue to wean as able  Acute saddle PE -CTA chest: Saddle embolus with no associated findings of right heart strain. No definite pulmonary infarction; however, please note the markedly limited evaluation for pulmonary infarction in the setting of diffuse COVID-19 pneumonia. Extensive COVID-19 pneumonia. -Case was discussed with critical care, was not felt to be a candidate for thrombolytic therapy -Continue IV heparin -Echocardiogram without evidence of right heart strain -DVT ultrasound pending  COVID-19 -Tested positive on 8/31 -Patient  received baricitinib, steroids during his hospitalization earlier this month.  Remdesivir held due to elevated LFTs. -Due to ongoing coughing, respiratory failure, CT chest findings, we will continue on isolation precaution at this time  Anxiety -Continue home ativan     DVT prophylaxis: IV heparin   Code Status: Full code Family Communication: No family at bedside Disposition Plan:  Status is: Inpatient  Remains inpatient appropriate because:Inpatient level of care appropriate due to severity of illness   Dispo: The patient is from: Home              Anticipated d/c is to: Home              Anticipated d/c date is: 1 day              Patient currently is not medically stable to d/c.  Remains inpatient for IV heparin currently for saddle PE.  If hemodynamically stable, plan to transition to Xarelto for discharge home 9/25   Consultants:   None  Procedures:   None  Antimicrobials:  Anti-infectives (From admission, onward)   None       Objective: Vitals:   09/28/19 1840 09/28/19 2226 09/29/19 0254 09/29/19 0534  BP: 129/88 100/69 (!) 89/59 (!) 107/59  Pulse: 95 88 81 99  Resp: Temp: 98.4 F (36.9 C) 97.9 F (36.6 C) 98.8 F (37.1 C) 98.6 F (37 C)  TempSrc: Oral Oral Oral Oral  SpO2: 98% 94% 98% 91%  Weight:      Height:        Intake/Output Summary (Last 24 hours) at 09/29/2019 1232 Last data filed at 09/29/2019  1017 Gross per 24 hour  Intake 435.72 ml  Output 2775 ml  Net -2339.28 ml   Filed Weights   09/27/19 1719  Weight: 115.7 kg    Examination: General exam: Appears calm and comfortable  Respiratory system: Clear to auscultation. Respiratory effort normal. Cardiovascular system: S1 & S2 heard, RRR. No pedal edema. Gastrointestinal system: Abdomen is nondistended, soft and nontender. Normal bowel sounds heard. Central nervous system: Alert and oriented. Non focal exam. Speech clear  Extremities: Symmetric in appearance  bilaterally  Skin: No rashes, lesions or ulcers on exposed skin  Psychiatry: Judgement and insight appear stable. Mood & affect appropriate.    Data Reviewed: I have personally reviewed following labs and imaging studies  CBC: Recent Labs  Lab 09/26/19 1445 09/27/19 1814 09/28/19 0316 09/29/19 0020  WBC 17.9* 10.4 8.1 6.9  NEUTROABS 15.3* 8.3* 6.3  --   HGB 15.3 14.6 12.9* 12.7*  HCT 46.0 43.9 39.2 38.4*  MCV 90 90.1 92.0 92.8  PLT 149* 127* 106* 104*   Basic Metabolic Panel: Recent Labs  Lab 09/26/19 1445 09/27/19 1814 09/28/19 0316 09/29/19 0020  NA 138 137 140 134*  K 4.2 4.1 4.0 4.0  CL 98 101 101 101  CO2 28 26 30 24   GLUCOSE 89 74 123* 98  BUN 25* 30* 28* 20  CREATININE 0.83 1.11 1.12 0.88  CALCIUM 8.9 8.2* 8.2* 7.8*  MG  --   --  2.2  --    GFR: Estimated Creatinine Clearance: 131.4 mL/min (by C-G formula based on SCr of 0.88 mg/dL). Liver Function Tests: Recent Labs  Lab 09/26/19 1445 09/27/19 1814 09/28/19 0316 09/29/19 0020  AST 24 42* 27 24  ALT 116* 123* 98* 80*  ALKPHOS 97 69 61 62  BILITOT 0.5 0.8 0.7 0.8  PROT 5.8* 5.6* 4.9* 5.1*  ALBUMIN 3.5* 2.7* 2.5* 2.4*   No results for input(s): LIPASE, AMYLASE in the last 168 hours. No results for input(s): AMMONIA in the last 168 hours. Coagulation Profile: No results for input(s): INR, PROTIME in the last 168 hours. Cardiac Enzymes: No results for input(s): CKTOTAL, CKMB, CKMBINDEX, TROPONINI in the last 168 hours. BNP (last 3 results) No results for input(s): PROBNP in the last 8760 hours. HbA1C: No results for input(s): HGBA1C in the last 72 hours. CBG: No results for input(s): GLUCAP in the last 168 hours. Lipid Profile: Recent Labs    09/27/19 1814  TRIG 225*   Thyroid Function Tests: No results for input(s): TSH, T4TOTAL, FREET4, T3FREE, THYROIDAB in the last 72 hours. Anemia Panel: Recent Labs    09/27/19 1814 09/28/19 0316  FERRITIN 1,184* 723*   Sepsis Labs: Recent Labs    Lab 09/27/19 1814 09/27/19 2056  PROCALCITON <0.10  --   LATICACIDVEN 1.1 1.0    Recent Results (from the past 240 hour(s))  Blood Culture (routine x 2)     Status: None (Preliminary result)   Collection Time: 09/27/19  6:14 PM   Specimen: Site Not Specified; Blood  Result Value Ref Range Status   Specimen Description   Final    SITE NOT SPECIFIED Performed at [facility], [address], [place], [place]    Special Requests   Final    BOTTLES DRAWN AEROBIC AND ANAEROBIC Blood Culture results may not be optimal due to an excessive volume of blood received in culture bottles Performed at [facility], [address] [place], [place]    Culture  Final    NO GROWTH < 24 HOURS Performed at [facility], [address], [place], [place] [address]    Report Status PENDING  Incomplete  Blood Culture (routine x 2)     Status: None (Preliminary result)   Collection Time: 09/27/19  6:14 PM   Specimen: BLOOD  Result Value Ref Range Status   Specimen Description   Final    BLOOD SITE NOT SPECIFIED Performed at [facility], [address], [place], [place] [address]    Special Requests   Final    BOTTLES DRAWN AEROBIC AND ANAEROBIC Blood Culture adequate volume Performed at [facility], [address] [place], [place] [address]    Culture   Final    NO GROWTH < 24 HOURS Performed at [facility], [address], [place], [place] [address]    Report Status PENDING  Incomplete      Radiology Studies: CT Angio Chest PE W and/or Wo Contrast  Result Date: 09/27/2019 CLINICAL DATA:  Positive COVID test, shortness of breath, elevated D-dimer pulmonary embolus high probability suspected. EXAM: CT ANGIOGRAPHY CHEST WITH CONTRAST TECHNIQUE: Multidetector CT imaging of the chest was performed using the standard protocol during bolus administration of intravenous contrast. Multiplanar CT  image reconstructions and MIPs were obtained to evaluate the vascular anatomy. CONTRAST:  OMNIPAQUE IOHEXOL 350 MG/ML SOLN COMPARISON:  Chest x-ray 09/27/2019. FINDINGS: Cardiovascular: Satisfactory opacification of the pulmonary arteries to the segmental level. Saddle embolus of the main pulmonary arteries that extend to the segmental levels in all lobes and likely to the subsegmental level. Limited evaluation at the subsegmental level due to timing of contrast. The main pulmonary artery is normal in caliber. Normal heart size. No significant pericardial effusion. No paradoxical bowing or straightening of the interventricular septum. Normal ventricular ratio. No retrograde reflux of contrast within the inferior vena cava are hepatic veins. The thoracic aorta is normal in caliber. Mediastinum/Nodes: Several enlarged mediastinal lymph nodes with as an example a 1 cm right paratracheal lymph node (6:92) and a 1.3 cm a precarinal lymph node (6:106). Prominent right hilar lymph node. No definite left hilar lymphadenopathy. No enlarged axillary lymph nodes. Thyroid gland, trachea, and esophagus demonstrate no significant findings. Lungs/Pleura: Diffuse peribronchovascular and peripheral consolidative opacities with areas of subpleural sparing. Limited evaluation for pulmonary infarction in the setting of such diffuse disease. Possible trace right pleural effusion. No left pleural effusion. No pneumothorax. Upper Abdomen: No acute abnormality. Musculoskeletal: No chest wall abnormality Diffusely decreased bone density. No suspicious lytic or blastic osseous lesions. No acute displaced fracture. Multilevel degenerative changes of the spine. Review of the MIP images confirms the above findings. IMPRESSION: 1. Saddle embolus with no associated findings of right heart strain. No definite pulmonary infarction; however, please note the markedly limited evaluation for pulmonary infarction in the setting of diffuse COVID-19  pneumonia. 2. Extensive COVID-19 pneumonia. These results were called by telephone at the time of interpretation on 09/27/2019 at 8:26 pm to provider [facility] , who verbally acknowledged these results. Electronically Signed   By: Tish Frederickson M.D.   On: 09/27/2019 20:32   DG Chest Port 1 View  Result Date: 09/27/2019 CLINICAL DATA:  Hypoxia, shortness of breath, presumptive COVID 19 EXAM: PORTABLE CHEST 1 VIEW COMPARISON:  09/15/2019 FINDINGS: Single frontal view of the chest demonstrates a stable cardiac silhouette. Lung volumes are diminished, with slight progression of multifocal bilateral airspace disease. No effusion or pneumothorax. IMPRESSION: 1.  Decreased lung volumes, with progression of bilateral multifocal pneumonia. Electronically Signed   By: Sharlet Salina M.D.   On: 09/27/2019 18:08   ECHOCARDIOGRAM COMPLETE  Result Date: 09/28/2019    ECHOCARDIOGRAM REPORT   Patient Name:   Shawn [name] Date of Exam: 09/28/2019 Medical Rec #:  [id number]    Height:       71.0 in Accession #:    [id number]   Weight:       255.0 lb Date of Birth:  [birth year]     BSA:          2.338 m Patient Age:    49 years     BP:           110/58 mmHg Patient Gender: M            HR:           87 bpm. Exam Location:  Inpatient Procedure: 2D Echo Indications:    pulmonary embolus  History:        Patient has no prior history of Echocardiogram examinations.                 Covid +.  Sonographer:    Celene Skeen RDCS (AE) Referring Phys: [id number] Deno Lunger [facility]  Sonographer Comments: Technically difficult study due to poor echo windows. Image acquisition challenging due to respiratory motion. IMPRESSIONS  1. Left ventricular ejection fraction, by estimation, is 60 to 65%. The left ventricle has normal function. The left ventricle has no regional wall motion abnormalities. Left ventricular diastolic parameters are indeterminate.  2. Right ventricular systolic function is normal. The right ventricular size is normal.  3.  The mitral valve is normal in structure. No evidence of mitral valve regurgitation. No evidence of mitral stenosis.  4. The aortic valve is grossly normal. Aortic valve regurgitation is not visualized. No aortic stenosis is present. FINDINGS  Left Ventricle: Left ventricular ejection fraction, by estimation, is 60 to 65%. The left ventricle has normal function. The left ventricle has no regional wall motion abnormalities. The left ventricular internal cavity size was normal in size. There is  no left ventricular hypertrophy. Left ventricular diastolic parameters are indeterminate. Right Ventricle: The right ventricular size is normal. No increase in right ventricular wall thickness. Right ventricular systolic function is normal. Left Atrium: Left atrial size was normal in size. Right Atrium: Right atrial size was normal in size. Pericardium: There is no evidence of pericardial effusion. Mitral Valve: The mitral valve is normal in structure. No evidence of mitral valve regurgitation. No evidence of mitral valve stenosis. Tricuspid Valve: The tricuspid valve is normal in structure. Tricuspid valve regurgitation is not demonstrated. No evidence of tricuspid stenosis. Aortic Valve: The aortic valve is grossly normal. Aortic valve regurgitation is not visualized. No aortic stenosis is present. Pulmonic Valve: The pulmonic valve was grossly normal. Pulmonic valve regurgitation is not visualized. Aorta: The aortic root and ascending aorta are structurally normal, with no evidence of dilitation. IAS/Shunts: The atrial septum is grossly normal.  LEFT VENTRICLE PLAX 2D LVIDd:         4.30 cm  Diastology LVIDs:         2.60 cm  LV e' lateral:   12.10 cm/s LV PW:         0.90 cm  LV E/e' lateral: 6.8 LV IVS:        0.70 cm LVOT diam:     2.10 cm LV SV:  70 LV SV Index:   30 LVOT Area:     3.46 cm  RIGHT VENTRICLE TAPSE (M-mode): 3.0 cm LEFT ATRIUM           Index       RIGHT ATRIUM           Index LA diam:      2.90 cm  1.24 cm/m  RA Area:     14.20 cm LA Vol (A4C): 23.8 ml 10.18 ml/m RA Volume:   28.60 ml  12.23 ml/m  AORTIC VALVE LVOT Vmax:   108.00 cm/s LVOT Vmean:  78.400 cm/s LVOT VTI:    0.201 m  AORTA Ao Root diam: 2.70 cm MITRAL VALVE MV Area (PHT): 3.37 cm    SHUNTS MV Decel Time: 225 msec    Systemic VTI:  0.20 m MV E velocity: 82.30 cm/s  Systemic Diam: 2.10 cm MV A velocity: 69.40 cm/s MV E/A ratio:  1.19 Kristeen MissPhilip Nahser MD Electronically signed by Kristeen MissPhilip Nahser MD Signature Date/Time: 09/28/2019/11:52:39 AM    Final       Scheduled Meds: . chlorhexidine  15 mL Mouth Rinse BID  . mouth rinse  15 mL Mouth Rinse q12n4p   Continuous Infusions: . heparin 1,400 Units/hr (09/29/19 0546)     LOS: 2 days      Time spent: 40 minutes   Noralee StainJennifer Anurag Scarfo, DO [facility] 09/29/2019, 12:32 PM   Available via Epic secure chat 7am-7pm After these hours, please refer to coverage provider listed on [email or web address]

## 2019-09-29 NOTE — Plan of Care (Signed)
Patient is alert and oriented. Patient has been calm, cooperative, and very responsive today. He continues to experience pain when coughing but reports that it has improved.  Patient ate all of his meals. Patient is expected to be Discharged home once his SPO2 saturation improves. Will report to nurse Baxter Hire RN.  Problem: Activity: Goal: Risk for activity intolerance will decrease Outcome: Progressing   Problem: Nutrition: Goal: Adequate nutrition will be maintained Outcome: Progressing   Problem: Coping: Goal: Level of anxiety will decrease Outcome: Progressing

## 2019-09-30 ENCOUNTER — Inpatient Hospital Stay (HOSPITAL_COMMUNITY): Payer: BC Managed Care – PPO

## 2019-09-30 LAB — D-DIMER, QUANTITATIVE: D-Dimer, Quant: 7.12 ug/mL-FEU — ABNORMAL HIGH (ref 0.00–0.50)

## 2019-09-30 LAB — CBC
HCT: 37.7 % — ABNORMAL LOW (ref 39.0–52.0)
Hemoglobin: 12.7 g/dL — ABNORMAL LOW (ref 13.0–17.0)
MCH: 30.8 pg (ref 26.0–34.0)
MCHC: 33.7 g/dL (ref 30.0–36.0)
MCV: 91.3 fL (ref 80.0–100.0)
Platelets: 94 10*3/uL — ABNORMAL LOW (ref 150–400)
RBC: 4.13 MIL/uL — ABNORMAL LOW (ref 4.22–5.81)
RDW: 13.2 % (ref 11.5–15.5)
WBC: 6.5 10*3/uL (ref 4.0–10.5)
nRBC: 0 % (ref 0.0–0.2)

## 2019-09-30 LAB — COMPREHENSIVE METABOLIC PANEL
ALT: 62 U/L — ABNORMAL HIGH (ref 0–44)
AST: 21 U/L (ref 15–41)
Albumin: 2.8 g/dL — ABNORMAL LOW (ref 3.5–5.0)
Alkaline Phosphatase: 62 U/L (ref 38–126)
Anion gap: 9 (ref 5–15)
BUN: 15 mg/dL (ref 6–20)
CO2: 24 mmol/L (ref 22–32)
Calcium: 8 mg/dL — ABNORMAL LOW (ref 8.9–10.3)
Chloride: 103 mmol/L (ref 98–111)
Creatinine, Ser: 0.98 mg/dL (ref 0.61–1.24)
GFR calc Af Amer: >60 mL/min (ref >60)
GFR calc non Af Amer: >60 mL/min (ref >60)
Glucose, Bld: 98 mg/dL (ref 70–99)
Potassium: 4.1 mmol/L (ref 3.5–5.1)
Sodium: 136 mmol/L (ref 135–145)
Total Bilirubin: 0.5 mg/dL (ref 0.3–1.2)
Total Protein: 5.5 g/dL — ABNORMAL LOW (ref 6.5–8.1)

## 2019-09-30 LAB — HEPARIN LEVEL (UNFRACTIONATED): Heparin Unfractionated: 0.47 IU/mL (ref 0.30–0.70)

## 2019-09-30 MED ORDER — RIVAROXABAN (XARELTO) VTE STARTER PACK (15 & 20 MG): ORAL_TABLET | ORAL | 0 refills | Status: DC

## 2019-09-30 MED ORDER — RIVAROXABAN 15 MG PO TABS
15.0000 mg | ORAL_TABLET | Freq: Two times a day (BID) | ORAL | Status: DC
  Administered 2019-09-30: 15 mg via ORAL
  Filled 2019-09-30: qty 1

## 2019-09-30 MED ORDER — RIVAROXABAN 20 MG PO TABS: 20.0000 mg | ORAL_TABLET | Freq: Every day | ORAL | Status: DC

## 2019-09-30 NOTE — Progress Notes (Signed)
Per report please do not give ativan to pt as it has the reverse effect. Pt takes klonopin at home but it is not currently ordered PRN possibly due to respiratory status.

## 2019-09-30 NOTE — Discharge Summary (Signed)
Physician Discharge Summary  [name] QMV:[id number] DOB: 01-Sep-1970 DOA: 09/27/2019  PCP: Bennie Pierini, FNP  Admit date: 09/27/2019 Discharge date: 09/30/2019  Admitted From: Home Disposition:  Home  Recommendations for Outpatient Follow-up:  1. Follow up with PCP in 1 week  Discharge Condition: Stable CODE STATUS: Full  Diet recommendation:  Diet Orders (From admission, onward)    Start     Ordered   09/30/19 0000  Diet - low sodium heart healthy        09/30/19 1218   09/29/19 1304  Diet regular Room service appropriate? Yes with Assist; Fluid consistency: Thin  Diet effective now       Comments: Pt is lactose intolerant  Question Answer Comment  Room service appropriate? Yes with Assist   Fluid consistency: Thin      09/29/19 1303         Brief/Interim Summary: Shawn [name] is a 49 year old male with past medical history of obesity, recent diagnosis of COVID-19 infection requiring hospitalization and was in the hospital for 9/10 to 9/14 presenting with complaints of shortness of breath and chest pain.  Patient explains that since his discharge from [facility] on 9/14, he initially slowly began to feel better. During that hospitalization, he received Solu-Medrol as well as baricitinib.  He was discharged home on 4 L nasal cannula O2.  He returned back to the hospital due to chest pain as well as worsening shortness of breath.  Work-up revealed saddle pulmonary embolism without right heart strain.  Case was discussed with critical care, he did not require thrombolytics at this time. He was treated with IV heparin. Hemodynamics remained stable. He was transitioned to Xarelto prior to discharge.   Discharge Diagnoses:  Principal Problem:   Acute saddle pulmonary embolism without acute cor pulmonale (HCC) Active Problems:   Acute respiratory failure with hypoxia (HCC)   COVID-19 virus infection   Acute hypoxemic respiratory failure secondary to PE,  COVID-19 -Patient was discharged home recently on 9/14 on 4 L nasal cannula O2 -Currently remains on 4-6 L nasal cannula O2, continue to wean as able  Acute saddle PE and DVT  -CTA chest: Saddle embolus with no associated findings of right heart strain. No definite pulmonary infarction; however, please note the markedly limited evaluation for pulmonary infarction in the setting of diffuse COVID-19 pneumonia. Extensive COVID-19 pneumonia. -Case was discussed with critical care, was not felt to be a candidate for thrombolytic therapy -IV heparin --> Xarelto -Echocardiogram without evidence of right heart strain -DVT ultrasound showed prelim report positive for acute DVT in the right gastrocnemius and posterior tibial veins as well as the left posterior tibial and peroneal veins  COVID-19 -Tested positive on 8/31 -Patient received baricitinib, steroids during his hospitalization earlier this month.  Remdesivir held due to elevated LFTs.  Anxiety -Continue home klonopin    Discharge Instructions  Discharge Instructions    Call MD for:  difficulty breathing, headache or visual disturbances   Complete by: As directed    Call MD for:  extreme fatigue   Complete by: As directed    Call MD for:  persistant dizziness or light-headedness   Complete by: As directed    Call MD for:  persistant nausea and vomiting   Complete by: As directed    Call MD for:  severe uncontrolled pain   Complete by: As directed    Call MD for:  temperature >100.4   Complete by: As directed    Diet - low  sodium heart healthy   Complete by: As directed    Discharge instructions   Complete by: As directed    You were cared for by a hospitalist during your hospital stay. If you have any questions about your discharge medications or the care you received while you were in the hospital after you are discharged, you can call the unit and ask to speak with the hospitalist on call if the hospitalist that took care of  you is not available. Once you are discharged, your primary care physician will handle any further medical issues. Please note that NO REFILLS for any discharge medications will be authorized once you are discharged, as it is imperative that you return to your primary care physician (or establish a relationship with a primary care physician if you do not have one) for your aftercare needs so that they can reassess your need for medications and monitor your lab values.   Increase activity slowly   Complete by: As directed      Allergies as of 09/30/2019      Reactions   Codeine Other (See Comments)   HALLUCINATIONS      Medication List    STOP taking these medications   azithromycin 250 MG tablet Commonly known as: Zithromax Z-Pak   predniSONE 50 MG tablet Commonly known as: DELTASONE     TAKE these medications   ascorbic acid 500 MG tablet Commonly known as: VITAMIN C Take 1 tablet (500 mg total) by mouth daily.   clonazePAM 0.5 MG tablet Commonly known as: KLONOPIN Take 1 tablet (0.5 mg total) by mouth 2 (two) times daily as needed for anxiety.   Rivaroxaban Stater Pack (15 mg and 20 mg) Commonly known as: XARELTO STARTER PACK Follow package directions: Take one  tablet by mouth twice a day. On day 22, switch to one  tablet once a day. Take with food.   zinc sulfate 220 (50 Zn) MG capsule Take 1 capsule (220 mg total) by mouth daily.            Durable Medical Equipment  (From admission, onward)         Start     Ordered   09/30/19 0726  For home use only DME oxygen  Once       Question Answer Comment  Length of Need 6 Months   Mode or (Route) Nasal cannula   Liters per Minute 4   Frequency Continuous (stationary and portable oxygen unit needed)   Oxygen conserving device Yes   Oxygen delivery system Gas      09/30/19 0725          Follow-up Information    Bennie Pierini, FNP. Schedule an appointment as soon as possible for a visit in 1  week(s).   Specialty: Family Medicine Contact information: [address] [place] [address] [phone]              Allergies  Allergen Reactions  . Codeine Other (See Comments)    HALLUCINATIONS    Consultations:  None    Procedures/Studies: CT Angio Chest PE W and/or Wo Contrast  Result Date: 09/27/2019 CLINICAL DATA:  Positive COVID test, shortness of breath, elevated D-dimer pulmonary embolus high probability suspected. EXAM: CT ANGIOGRAPHY CHEST WITH CONTRAST TECHNIQUE: Multidetector CT imaging of the chest was performed using the standard protocol during bolus administration of intravenous contrast. Multiplanar CT image reconstructions and MIPs were obtained to evaluate the vascular anatomy. CONTRAST:  OMNIPAQUE IOHEXOL 350  MG/ML SOLN COMPARISON:  Chest x-ray 09/27/2019. FINDINGS: Cardiovascular: Satisfactory opacification of the pulmonary arteries to the segmental level. Saddle embolus of the main pulmonary arteries that extend to the segmental levels in all lobes and likely to the subsegmental level. Limited evaluation at the subsegmental level due to timing of contrast. The main pulmonary artery is normal in caliber. Normal heart size. No significant pericardial effusion. No paradoxical bowing or straightening of the interventricular septum. Normal ventricular ratio. No retrograde reflux of contrast within the inferior vena cava are hepatic veins. The thoracic aorta is normal in caliber. Mediastinum/Nodes: Several enlarged mediastinal lymph nodes with as an example a 1 cm right paratracheal lymph node (6:92) and a 1.3 cm a precarinal lymph node (6:106). Prominent right hilar lymph node. No definite left hilar lymphadenopathy. No enlarged axillary lymph nodes. Thyroid gland, trachea, and esophagus demonstrate no significant findings. Lungs/Pleura: Diffuse peribronchovascular and peripheral consolidative opacities with areas of subpleural sparing. Limited evaluation  for pulmonary infarction in the setting of such diffuse disease. Possible trace right pleural effusion. No left pleural effusion. No pneumothorax. Upper Abdomen: No acute abnormality. Musculoskeletal: No chest wall abnormality Diffusely decreased bone density. No suspicious lytic or blastic osseous lesions. No acute displaced fracture. Multilevel degenerative changes of the spine. Review of the MIP images confirms the above findings. IMPRESSION: 1. Saddle embolus with no associated findings of right heart strain. No definite pulmonary infarction; however, please note the markedly limited evaluation for pulmonary infarction in the setting of diffuse COVID-19 pneumonia. 2. Extensive COVID-19 pneumonia. These results were called by telephone at the time of interpretation on 09/27/2019 at 8:26 pm to provider [facility] , who verbally acknowledged these results. Electronically Signed   By: Tish Frederickson M.D.   On: 09/27/2019 20:32   CT ANGIO CHEST PE W OR WO CONTRAST  Addendum Date: 09/18/2019   ADDENDUM REPORT: 09/18/2019 17:11 ADDENDUM: These results (specifically regarding asymmetric gynecomastia) were called by telephone at the time of interpretation on 09/18/2019 at 4:38pm to provider DAVID TAT , who verbally acknowledged these results. Electronically Signed   By: Tish Frederickson M.D.   On: 09/18/2019 17:11   Result Date: 09/18/2019 CLINICAL DATA:  Shortness of breath and cough, diagnosed with COVID-19 on 09/06/2019. History of former smoker, elevated D dimer, checks x-ray today showing diffuse bilateral patchy and slightly nodular pulmonary opacities consistent with bilateral pneumonia. EXAM: CT ANGIOGRAPHY CHEST WITH CONTRAST TECHNIQUE: Multidetector CT imaging of the chest was performed using the standard protocol during bolus administration of intravenous contrast. Multiplanar CT image reconstructions and MIPs were obtained to evaluate the vascular anatomy. CONTRAST:  OMNIPAQUE IOHEXOL 350 MG/ML  SOLN COMPARISON:  Chest x-ray 09/15/2019, CT chest 06/28/2014 FINDINGS: Cardiovascular: Satisfactory opacification of the pulmonary arteries to the segmental level. Suggestion of vascular cut offs within the right lower lobe; however, limited evaluation due to timing of contrast. The main pulmonary artery measures at the upper limits of normal (3 cm). No evidence of pulmonary embolism. Normal heart size. No significant pericardial effusion. The ascending thoracic aorta is stable in caliber measuring to 3.9 cm. The descending thoracic aorta is normal in caliber. No thoracic aorta atherosclerotic plaque. No definite coronary artery calcification. Mediastinum/Nodes: Multiple prominent as well as several mildly enlarged mediastinal lymph nodes with as an example a 1.1 cm right paratracheal lymph node (5:75), and a right precarinal lymph node measuring 1.2 cm (5:89), as well as a 0.9 cm prevascular lymph node (5:89). Several posterior mediastinal lymph nodes are prominent size  with ascending example a 0.8 cm (5:20) and 0.9 cm (9:173) periaortic lymph nodes. Bilateral enlarged perihilar lymphadenopathy with as an example a 1.2 cm right (5:131) and a 0.9 cm left (5:119) lymph node. No axillary lymphadenopathy. Lungs/Pleura: Extensive diffuse peribronchovascular and peripheral patchy consolidative opacities. Linear atelectasis versus scarring in the left lower lobe. No definite pulmonary mass. Central airways are patent. Upper Abdomen: No acute abnormality. Musculoskeletal: Interval development of asymmetric right greater than left gynecomastia. No suspicious lytic or blastic osseous lesions. No acute displaced fracture. Multilevel degenerative changes of the spine. Review of the MIP images confirms the above findings. IMPRESSION: 1. No central or segmental pulmonary embolus. Limited evaluation at the subsegmental level due to timing of contrast. 2. Extensive diffuse consolidative peribronchovascular and peripheral  consolidations consistent with bilateral pneumonia in a patient with known COVID-19 infection. 3. Likely reactive mediastinal and hilar lymphadenopathy. 4. Interval development of asymmetric right greater than left gynecomastia. Given asymmetry, consider non-emergent mammographic/sonographic evaluation. Electronically Signed: By: Tish Frederickson M.D. On: 09/18/2019 15:44   US Venous Img Lower Bilateral (DVT)  Result Date: 09/19/2019 CLINICAL DATA:  Bilateral lower extremity edema. Elevated D-dimer. Evaluate for DVT. EXAM: BILATERAL LOWER EXTREMITY VENOUS DOPPLER ULTRASOUND TECHNIQUE: Gray-scale sonography with graded compression, as well as color Doppler and duplex ultrasound were performed to evaluate the lower extremity deep venous systems from the level of the common femoral vein and including the common femoral, femoral, profunda femoral, popliteal and calf veins including the posterior tibial, peroneal and gastrocnemius veins when visible. The superficial great saphenous vein was also interrogated. Spectral Doppler was utilized to evaluate flow at rest and with distal augmentation maneuvers in the common femoral, femoral and popliteal veins. COMPARISON:  None. FINDINGS: RIGHT LOWER EXTREMITY Common Femoral Vein: No evidence of thrombus. Normal compressibility, respiratory phasicity and response to augmentation. Saphenofemoral Junction: No evidence of thrombus. Normal compressibility and flow on color Doppler imaging. Profunda Femoral Vein: No evidence of thrombus. Normal compressibility and flow on color Doppler imaging. Femoral Vein: No evidence of thrombus. Normal compressibility, respiratory phasicity and response to augmentation. Popliteal Vein: No evidence of thrombus. Normal compressibility, respiratory phasicity and response to augmentation. Calf Veins: No evidence of thrombus. Normal compressibility and flow on color Doppler imaging. Superficial Great Saphenous Vein: No evidence of thrombus. Normal  compressibility. Venous Reflux:  None. Other Findings:  None. LEFT LOWER EXTREMITY Common Femoral Vein: No evidence of thrombus. Normal compressibility, respiratory phasicity and response to augmentation. Saphenofemoral Junction: No evidence of thrombus. Normal compressibility and flow on color Doppler imaging. Profunda Femoral Vein: No evidence of thrombus. Normal compressibility and flow on color Doppler imaging. Femoral Vein: No evidence of thrombus. Normal compressibility, respiratory phasicity and response to augmentation. Popliteal Vein: No evidence of thrombus. Normal compressibility, respiratory phasicity and response to augmentation. Calf Veins: No evidence of thrombus. Normal compressibility and flow on color Doppler imaging. Superficial Great Saphenous Vein: No evidence of thrombus. Normal compressibility. Venous Reflux:  None. Other Findings:  None. IMPRESSION: No evidence of DVT within either lower extremity. Electronically Signed   By: Simonne Come M.D.   On: 09/19/2019 10:07   DG Chest Port 1 View  Result Date: 09/27/2019 CLINICAL DATA:  Hypoxia, shortness of breath, presumptive COVID 19 EXAM: PORTABLE CHEST 1 VIEW COMPARISON:  09/15/2019 FINDINGS: Single frontal view of the chest demonstrates a stable cardiac silhouette. Lung volumes are diminished, with slight progression of multifocal bilateral airspace disease. No effusion or pneumothorax. IMPRESSION: 1. Decreased lung volumes, with progression of bilateral  multifocal pneumonia. Electronically Signed   By: Sharlet Salina M.D.   On: 09/27/2019 18:08   DG Chest Portable 1 View  Result Date: 09/15/2019 CLINICAL DATA:  Shortness of breath history of COVID EXAM: PORTABLE CHEST 1 VIEW COMPARISON:  03/07/2013 FINDINGS: Diffuse bilateral patchy and slightly nodular pulmonary opacity. No pleural effusion. Stable cardiomediastinal silhouette. No pneumothorax. IMPRESSION: Diffuse bilateral patchy and slightly nodular pulmonary opacity consistent most  likely due to bilateral pneumonia and history of COVID positivity Electronically Signed   By: Jasmine Pang M.D.   On: 09/15/2019 15:30   ECHOCARDIOGRAM COMPLETE  Result Date: 09/28/2019    ECHOCARDIOGRAM REPORT   Patient Name:   Shawn [name] Date of Exam: 09/28/2019 Medical Rec #:  [id number]    Height:       71.0 in Accession #:    [id number]   Weight:       255.0 lb Date of Birth:  February 21, 1970     BSA:          2.338 m Patient Age:    49 years     BP:           110/58 mmHg Patient Gender: M            HR:           87 bpm. Exam Location:  Inpatient Procedure: 2D Echo Indications:    pulmonary embolus  History:        Patient has no prior history of Echocardiogram examinations.                 Covid +.  Sonographer:    Celene Skeen RDCS (AE) Referring Phys: [id number] [facility]o [facility]  Sonographer Comments: Technically difficult study due to poor echo windows. Image acquisition challenging due to respiratory motion. IMPRESSIONS  1. Left ventricular ejection fraction, by estimation, is 60 to 65%. The left ventricle has normal function. The left ventricle has no regional wall motion abnormalities. Left ventricular diastolic parameters are indeterminate.  2. Right ventricular systolic function is normal. The right ventricular size is normal.  3. The mitral valve is normal in structure. No evidence of mitral valve regurgitation. No evidence of mitral stenosis.  4. The aortic valve is grossly normal. Aortic valve regurgitation is not visualized. No aortic stenosis is present. FINDINGS  Left Ventricle: Left ventricular ejection fraction, by estimation, is 60 to 65%. The left ventricle has normal function. The left ventricle has no regional wall motion abnormalities. The left ventricular internal cavity size was normal in size. There is  no left ventricular hypertrophy. Left ventricular diastolic parameters are indeterminate. Right Ventricle: The right ventricular size is normal. No increase in right ventricular wall  thickness. Right ventricular systolic function is normal. Left Atrium: Left atrial size was normal in size. Right Atrium: Right atrial size was normal in size. Pericardium: There is no evidence of pericardial effusion. Mitral Valve: The mitral valve is normal in structure. No evidence of mitral valve regurgitation. No evidence of mitral valve stenosis. Tricuspid Valve: The tricuspid valve is normal in structure. Tricuspid valve regurgitation is not demonstrated. No evidence of tricuspid stenosis. Aortic Valve: The aortic valve is grossly normal. Aortic valve regurgitation is not visualized. No aortic stenosis is present. Pulmonic Valve: The pulmonic valve was grossly normal. Pulmonic valve regurgitation is not visualized. Aorta: The aortic root and ascending aorta are structurally normal, with no evidence of dilitation. IAS/Shunts: The atrial septum is grossly normal.  LEFT VENTRICLE PLAX 2D LVIDd:  4.30 cm  Diastology LVIDs:         2.60 cm  LV e' lateral:   12.10 cm/s LV PW:         0.90 cm  LV E/e' lateral: 6.8 LV IVS:        0.70 cm LVOT diam:     2.10 cm LV SV:         70 LV SV Index:   30 LVOT Area:     3.46 cm  RIGHT VENTRICLE TAPSE (M-mode): 3.0 cm LEFT ATRIUM           Index       RIGHT ATRIUM           Index LA diam:      2.90 cm 1.24 cm/m  RA Area:     14.20 cm LA Vol (A4C): 23.8 ml 10.18 ml/m RA Volume:   28.60 ml  12.23 ml/m  AORTIC VALVE LVOT Vmax:   108.00 cm/s LVOT Vmean:  78.400 cm/s LVOT VTI:    0.201 m  AORTA Ao Root diam: 2.70 cm MITRAL VALVE MV Area (PHT): 3.37 cm    SHUNTS MV Decel Time: 225 msec    Systemic VTI:  0.20 m MV E velocity: 82.30 cm/s  Systemic Diam: 2.10 cm MV A velocity: 69.40 cm/s MV E/A ratio:  1.19 Kristeen Miss MD Electronically signed by Kristeen Miss MD Signature Date/Time: 09/28/2019/11:52:39 AM    Final    US Abdomen Limited RUQ  Result Date: 09/16/2019 CLINICAL DATA:  Elevated LFTs EXAM: ULTRASOUND ABDOMEN LIMITED RIGHT UPPER QUADRANT COMPARISON:  CT  abdomen and pelvis 06/09/2013 FINDINGS: Gallbladder: Normally distended without stones or wall thickening. No pericholecystic fluid or sonographic Murphy sign. Common bile duct: Diameter: 4 mm, normal Liver: Echogenic parenchyma, likely fatty infiltration though this can be seen with cirrhosis and certain infiltrative disorders. No focal hepatic mass or nodularity. Portal vein patent with normal direction of blood flow towards the liver. Other: No RIGHT upper quadrant free fluid. IMPRESSION: Probable fatty infiltration of liver as above. Otherwise negative exam. Electronically Signed   By: Ulyses Southward M.D.   On: 09/16/2019 14:31        Discharge Exam: Vitals:   09/30/19 0845 09/30/19 0854  BP:    Pulse:    Resp:    Temp:    SpO2: 95% 91%    General: Pt is alert, awake, not in acute distress Cardiovascular: RRR, S1/S2 +, no edema Respiratory: CTA bilaterally, no wheezing, no rhonchi, no respiratory distress, no conversational dyspnea  Abdominal: Soft, NT, ND, bowel sounds + Extremities: no edema, no cyanosis Psych: Normal mood and affect, stable judgement and insight, +anxious     The results of significant diagnostics from this hospitalization (including imaging, microbiology, ancillary and laboratory) are listed below for reference.     Microbiology: Recent Results (from the past 240 hour(s))  Blood Culture (routine x 2)     Status: None (Preliminary result)   Collection Time: 09/27/19  6:14 PM   Specimen: Site Not Specified; Blood  Result Value Ref Range Status   Specimen Description   Final    SITE NOT SPECIFIED Performed at [facility], [address], [place], [place] [address]    Special Requests   Final    BOTTLES DRAWN AEROBIC AND ANAEROBIC Blood Culture results may not be optimal due to an excessive volume of blood received in culture bottles Performed at [facility], [address] [place],  [place] [id number]    Culture    Final    NO GROWTH < 24 HOURS Performed at [facility], [address] [place], [place] [address]    Report Status PENDING  Incomplete  Blood Culture (routine x 2)     Status: None (Preliminary result)   Collection Time: 09/27/19  6:14 PM   Specimen: BLOOD  Result Value Ref Range Status   Specimen Description   Final    BLOOD SITE NOT SPECIFIED Performed at [facility], [address] [place], [place] [address]    Special Requests   Final    BOTTLES DRAWN AEROBIC AND ANAEROBIC Blood Culture adequate volume Performed at [facility], [address] [place], [place] [address]    Culture   Final    NO GROWTH < 24 HOURS Performed at [facility], [address] [place], [place] [address]    Report Status PENDING  Incomplete     Labs: BNP (last 3 results) Recent Labs    09/16/19 0721 09/17/19 0736  BNP 77.0 117.0*   Basic Metabolic Panel: Recent Labs  Lab 09/26/19 1445 09/27/19 1814 09/28/19 0316 09/29/19 0020 09/30/19 0404  NA 138 137 140 134* 136  K 4.2 4.1 4.0 4.0 4.1  CL 98 101 101 101 103  CO2 28 26 30 24 24   GLUCOSE 89 74 123* 98 98  BUN 25* 30* 28* 20 15  CREATININE 0.83 1.11 1.12 0.88 0.98  CALCIUM 8.9 8.2* 8.2* 7.8* 8.0*  MG  --   --  2.2  --   --    Liver Function Tests: Recent Labs  Lab 09/26/19 1445 09/27/19 1814 09/28/19 0316 09/29/19 0020 09/30/19 0404  AST 24 42* 27 24 21   ALT 116* 123* 98* 80* 62*  ALKPHOS 97 69 61 62 62  BILITOT 0.5 0.8 0.7 0.8 0.5  PROT 5.8* 5.6* 4.9* 5.1* 5.5*  ALBUMIN 3.5* 2.7* 2.5* 2.4* 2.8*   No results for input(s): LIPASE, AMYLASE in the last 168 hours. No results for input(s): AMMONIA in the last 168 hours. CBC: Recent Labs  Lab 09/26/19 1445 09/27/19 1814 09/28/19 0316 09/29/19 0020 09/30/19 0404  WBC 17.9* 10.4 8.1 6.9 6.5  NEUTROABS 15.3* 8.3* 6.3  --   --   HGB 15.3 14.6 12.9* 12.7* 12.7*  HCT 46.0 43.9 39.2 38.4* 37.7*  MCV 90 90.1 92.0 92.8 91.3  PLT  149* 127* 106* 104* 94*   Cardiac Enzymes: No results for input(s): CKTOTAL, CKMB, CKMBINDEX, TROPONINI in the last 168 hours. BNP: Invalid input(s): POCBNP CBG: No results for input(s): GLUCAP in the last 168 hours. D-Dimer Recent Labs    09/29/19 0020 09/30/19 0404  DDIMER 15.08* 7.12*   Hgb A1c No results for input(s): HGBA1C in the last 72 hours. Lipid Profile Recent Labs    09/27/19 1814  TRIG 225*   Thyroid function studies No results for input(s): TSH, T4TOTAL, T3FREE, THYROIDAB in the last 72 hours.  Invalid input(s): FREET3 Anemia work up Recent Labs    09/27/19 1814 09/28/19 0316  FERRITIN 1,184* 723*   Urinalysis    Component Value Date/Time   BILIRUBINUR negative 06/07/2013 1405   PROTEINUR trace 06/07/2013 1405   UROBILINOGEN negative 06/07/2013 1405   NITRITE negative 06/07/2013 1405   LEUKOCYTESUR Negative 06/07/2013 1405   Sepsis Labs Invalid input(s): PROCALCITONIN,  WBC,  LACTICIDVEN Microbiology Recent Results (from the past 240 hour(s))  Blood Culture (routine x 2)  Status: None (Preliminary result)   Collection Time: 09/27/19  6:14 PM   Specimen: Site Not Specified; Blood  Result Value Ref Range Status   Specimen Description   Final    SITE NOT SPECIFIED Performed at [facility], [address], [place], [place] [address]    Special Requests   Final    BOTTLES DRAWN AEROBIC AND ANAEROBIC Blood Culture results may not be optimal due to an excessive volume of blood received in culture bottles Performed at [facility], [address] [place], [place] [address]    Culture   Final    NO GROWTH < 24 HOURS Performed at [facility], [address] [place], [place] [address]    Report Status PENDING  Incomplete  Blood Culture (routine x 2)     Status: None (Preliminary result)   Collection Time: 09/27/19  6:14 PM   Specimen: BLOOD  Result Value Ref Range Status   Specimen Description    Final    BLOOD SITE NOT SPECIFIED Performed at [facility], [address], [place], [place] [address]    Special Requests   Final    BOTTLES DRAWN AEROBIC AND ANAEROBIC Blood Culture adequate volume Performed at [facility], [address], [place], [place] [address]    Culture   Final    NO GROWTH < 24 HOURS Performed at [facility], [address] [place], [place] [address]    Report Status PENDING  Incomplete     Patient was seen and examined on the day of discharge and was found to be in stable condition. Time coordinating discharge: 35 minutes including assessment and coordination of care, as well as examination of the patient.   SIGNED:  Noralee Stain, DO [facility] 09/30/2019, 12:19 PM

## 2019-09-30 NOTE — Progress Notes (Signed)
PT Cancellation Note  Patient Details Name: Shawn [name] MRN: [id number] DOB: 08-13-1970   Cancelled Treatment:    Reason Eval/Treat Not Completed: PT screened, no needs identified, will sign off. Spoke with RN who reports pt is ambulatory-does not need PT. Will sign off    Faye Ramsay, PT Acute Rehabilitation  Office: [phone] Pager: [phone]

## 2019-09-30 NOTE — Progress Notes (Signed)
RN reviewed discharge paperwork with patient. RN provided Xarelto starter kit with discount card provided by pharmacy. RN reviewed medication times with patient who verbalized understanding. Tele removed. IV's removed x 2. VSS. All questions addressed. Pt discharged home with home O2. Pt reports he has a pulse oximeter and an incentive spirometer at home. RN encouraged pt and his wife to encourage him to use his incentive spirometer. No further needs at this time.

## 2019-09-30 NOTE — Discharge Instructions (Signed)
Information on my medicine - XARELTO (rivaroxaban)  This medication education was reviewed with me or my healthcare representative as part of my discharge preparation.  The pharmacist that spoke with me during my hospital stay was:    WHY WAS XARELTO PRESCRIBED FOR YOU? Xarelto was prescribed to treat blood clots that may have been found in the veins of your legs (deep vein thrombosis) or in your lungs (pulmonary embolism) and to reduce the risk of them occurring again.  What do you need to know about Xarelto? The starting dose is one 15 mg tablet taken TWICE daily with food for the FIRST 21 DAYS then on 10/16  the dose is changed to one 20 mg tablet taken ONCE A DAY with your evening meal.  DO NOT stop taking Xarelto without talking to the health care provider who prescribed the medication.  Refill your prescription for 20 mg tablets before you run out.  After discharge, you should have regular check-up appointments with your healthcare provider that is prescribing your Xarelto.  In the future your dose may need to be changed if your kidney function changes by a significant amount.  What do you do if you miss a dose? If you are taking Xarelto TWICE DAILY and you miss a dose, take it as soon as you remember. You may take two 15 mg tablets (total 30 mg) at the same time then resume your regularly scheduled 15 mg twice daily the next day.  If you are taking Xarelto ONCE DAILY and you miss a dose, take it as soon as you remember on the same day then continue your regularly scheduled once daily regimen the next day. Do not take two doses of Xarelto at the same time.   Important Safety Information Xarelto is a blood thinner medicine that can cause bleeding. You should call your healthcare provider right away if you experience any of the following: ? Bleeding from an injury or your nose that does not stop. ? Unusual colored urine (red or dark brown) or unusual colored stools (red or  black). ? Unusual bruising for unknown reasons. ? A serious fall or if you hit your head (even if there is no bleeding).  Some medicines may interact with Xarelto and might increase your risk of bleeding while on Xarelto. To help avoid this, consult your healthcare provider or pharmacist prior to using any new prescription or non-prescription medications, including herbals, vitamins, non-steroidal anti-inflammatory drugs (NSAIDs) and supplements.  This website has more information on Xarelto: www.xarelto.com.  

## 2019-09-30 NOTE — Progress Notes (Signed)
ANTICOAGULATION CONSULT NOTE  Pharmacy Consult for Xarelto Indication: pulmonary embolus  Allergies  Allergen Reactions  . Codeine Other (See Comments)    HALLUCINATIONS    Patient Measurements: Height: 5\' 11"  (180.3 cm) Weight: 115.7 kg (255 lb) IBW/kg (Calculated) : 75.3 Heparin Dosing Weight: 100 kg  Vital Signs: Temp: 98.5 F (36.9 C) (09/25 0535) Temp Source: Axillary (09/24 2058) BP: 107/76 (09/25 0535) Pulse Rate: 85 (09/25 0535)  Labs: Recent Labs     0000 09/27/19 1814 09/27/19 2238 09/28/19 0230 09/28/19 0316 09/28/19 1020 09/29/19 0020 09/29/19 0519 09/30/19 0404  HGB   < > 14.6  --   --  12.9*  --  12.7*  --  12.7*  HCT   < > 43.9  --   --  39.2  --  38.4*  --  37.7*  PLT   < > 127*  --   --  106*  --  104*  --  94*  HEPARINUNFRC  --   --   --    < >  --    < > 0.55 0.66 0.47  CREATININE   < > 1.11  --   --  1.12  --  0.88  --  0.98  TROPONINIHS  --  8 7  --   --   --   --   --   --    < > = values in this interval not displayed.    Estimated Creatinine Clearance: 118 mL/min (by C-G formula based on SCr of 0.98 mg/dL).   Medical History: History reviewed. No pertinent past medical history.  Assessment: 49 y/o M recently hospitalized from 9/10-9/14 with COVID PNA admited with ShOB and weakness. Patient was told to come to the ED to monitor LFTs for remdesivir as was unable to receive except for one dose previously due to elevated LFTs, however was initially  diagnosed on August 31 so remdesivir would not be prudent at this point and could potentially exacerbate liver insult. In the ED, d-dimer was > 20 and CT revealed PE. Pharmacy consulted to transition from heparin drip to Xarelto.   09/30/19 7:36 AM   CBC stable  No bleeding reported  SCr stable  Goal of Therapy:  Monitor platelets by anticoagulation protocol: Yes   Plan:   Transition to Xarelto 15 mg bid x 21 days followed by 20 mg daily  Instructed RN to stop drip at time of first  dose  Patient education   10/02/19, PharmD, BCPS [phone]

## 2019-09-30 NOTE — Progress Notes (Signed)
Lower extremity venous bilateral study completed.  Preliminary results relayed to Alvino Chapel, DO and RN.   See CV Proc for preliminary results report.   Jean Rosenthal, RDMS

## 2019-09-30 NOTE — Progress Notes (Signed)
ANTICOAGULATION CONSULT NOTE  Pharmacy Consult for Heparin  Indication: pulmonary embolus  Allergies  Allergen Reactions  . Codeine Other (See Comments)    HALLUCINATIONS    Patient Measurements: Height: 5\' 11"  (180.3 cm) Weight: 115.7 kg (255 lb) IBW/kg (Calculated) : 75.3 Heparin Dosing Weight: 100 kg  Vital Signs: Temp: 98.5 F (36.9 C) (09/25 0535) Temp Source: Axillary (09/24 2058) BP: 107/76 (09/25 0535) Pulse Rate: 85 (09/25 0535)  Labs: Recent Labs     0000 09/27/19 1814 09/27/19 2238 09/28/19 0230 09/28/19 0316 09/28/19 1020 09/29/19 0020 09/29/19 0519 09/30/19 0404  HGB   < > 14.6  --   --  12.9*  --  12.7*  --   --   HCT  --  43.9  --   --  39.2  --  38.4*  --   --   PLT  --  127*  --   --  106*  --  104*  --   --   HEPARINUNFRC  --   --   --    < >  --    < > 0.55 0.66 0.47  CREATININE   < > 1.11  --   --  1.12  --  0.88  --  0.98  TROPONINIHS  --  8 7  --   --   --   --   --   --    < > = values in this interval not displayed.    Estimated Creatinine Clearance: 118 mL/min (by C-G formula based on SCr of 0.98 mg/dL).   Medical History: History reviewed. No pertinent past medical history.  Assessment: 49 y/o M recently hospitalized from 9/10-9/14 with COVID PNA admited with ShOB and weakness. Patient was told to come to the ED to monitor LFTs for remdesivir as was unable to receive except for one dose previously due to elevated LFTs, however was initially  diagnosed on August 31 so remdesivir would not be prudent at this point and could potentially exacerbate liver insult. In the ED, d-dimer was > 20 and CT revealed PE. Pharmacy consulted for heparin infusion.    Today, 09/30/19:  Heparin level = 0.47 units/mL, therapeutic  CBC: Hgb stable at  12.7, Pltc decreased to 104K  No bleeding or infusion issues noted per nursing   Goal of Therapy:  Heparin level 0.3-0.7 units/ml Monitor platelets by anticoagulation protocol: Yes   Plan:    Continue heparin infusion to 1400 units/hr  Daily CBC, heparin level  Monitor closely for s/sx of bleeding   10/02/19, PharmD, BCPS 09/30/2019 6:14 AM

## 2019-10-02 ENCOUNTER — Telehealth: Payer: Self-pay | Admitting: Nurse Practitioner

## 2019-10-02 LAB — CULTURE, BLOOD (ROUTINE X 2)
Culture: NO GROWTH
Culture: NO GROWTH
Special Requests: ADEQUATE

## 2019-10-02 NOTE — Telephone Encounter (Signed)
Appointment scheduled 10/11/2019 at 8:30 am with Gennette Pac.

## 2019-10-02 NOTE — Telephone Encounter (Signed)
Left message to call back  

## 2019-10-10 ENCOUNTER — Inpatient Hospital Stay: Payer: Self-pay | Admitting: Nurse Practitioner

## 2019-10-11 ENCOUNTER — Encounter: Payer: Self-pay | Admitting: Nurse Practitioner

## 2019-10-11 ENCOUNTER — Other Ambulatory Visit: Payer: Self-pay

## 2019-10-11 ENCOUNTER — Ambulatory Visit (INDEPENDENT_AMBULATORY_CARE_PROVIDER_SITE_OTHER): Payer: BC Managed Care – PPO | Admitting: Nurse Practitioner

## 2019-10-11 VITALS — BP 121/93 | HR 79 | Temp 98.0°F | Ht 71.0 in | Wt 249.0 lb

## 2019-10-11 DIAGNOSIS — Z09 Encounter for follow-up examination after completed treatment for conditions other than malignant neoplasm: Secondary | ICD-10-CM | POA: Diagnosis not present

## 2019-10-11 DIAGNOSIS — I2692 Saddle embolus of pulmonary artery without acute cor pulmonale: Secondary | ICD-10-CM | POA: Diagnosis not present

## 2019-10-11 DIAGNOSIS — Z8616 Personal history of COVID-19: Secondary | ICD-10-CM

## 2019-10-11 DIAGNOSIS — U071 COVID-19: Secondary | ICD-10-CM

## 2019-10-11 NOTE — Progress Notes (Signed)
Subjective:    Patient ID: Shawn [name], male    DOB: 1970/03/02, 49 y.o.   MRN: [id number]   Chief Complaint: Hospitalization Follow-up   HPI Patient was dx with covid over a month ago. Was in hospital for several days. He was not able  To get IV remdisavir due to elevated LFT"S. Was suppose to come in for follow up but was still symptomatic with SOB and inability to keep oxygen saturation up above 90 despite being on 5l of oxygen at home. He was able to have labs repeated and LFT's had improved. We were trying to set him up for remdisavir  when went back to hospital on 09/27/19 and was discovered to have a saddle emboli. While in the hospital he was started on xeralto but did not get remdisavir. He is still c/o anxiety, sob on exertion. He has some memory issues and feels exhausted. He is on 3L of oxygen via nasal cannula.   Review of Systems  Constitutional: Negative for diaphoresis.  Eyes: Negative for pain.  Respiratory: Positive for cough and shortness of breath.   Cardiovascular: Negative for chest pain, palpitations and leg swelling.  Gastrointestinal: Negative for abdominal pain.  Endocrine: Negative for polydipsia.  Skin: Negative for rash.  Neurological: Negative for dizziness, weakness and headaches.  Hematological: Does not bruise/bleed easily.  All other systems reviewed and are negative.      Objective:   Physical Exam Vitals and nursing note reviewed.  Constitutional:      Appearance: Normal appearance. He is well-developed.  HENT:     Head: Normocephalic.     Nose: Nose normal.  Eyes:     Pupils: Pupils are equal, round, and reactive to light.  Neck:     Thyroid: No thyroid mass or thyromegaly.     Vascular: No carotid bruit or JVD.     Trachea: Phonation normal.  Cardiovascular:     Rate and Rhythm: Normal rate and regular rhythm.  Pulmonary:     Effort: Respiratory distress present.     Breath sounds: Normal breath sounds. No wheezing.     Comments:  Diminished breath sound in bases Abdominal:     General: Bowel sounds are normal.     Palpations: Abdomen is soft.     Tenderness: There is no abdominal tenderness.  Musculoskeletal:        General: Normal range of motion.     Cervical back: Normal range of motion and neck supple.  Lymphadenopathy:     Cervical: No cervical adenopathy.  Skin:    General: Skin is warm and dry.  Neurological:     Mental Status: He is alert and oriented to person, place, and time.  Psychiatric:        Behavior: Behavior normal.        Thought Content: Thought content normal.        Judgment: Judgment normal.      BP (!) 121/93   Pulse 79   Temp 98 F (36.7 C) (Oral)   Ht 5\' 11"  (1.803 m)   Wt 249 lb (112.9 kg)   BMI 34.73 kg/m        Assessment & Plan:  Shawn [name] in today with chief complaint of Hospitalization Follow-up   1. Acute saddle pulmonary embolism without acute cor pulmonale (HCC)  2. COVID-19 virus infection  3. Hospital discharge follow-up Hospital records reviewed Follow up in 1 week Repeat d dimer and cmp next week Call if has questions or  is worsening again    The above assessment and management plan was discussed with the patient. The patient verbalized understanding of and has agreed to the management plan. Patient is aware to call the clinic if symptoms persist or worsen. Patient is aware when to return to the clinic for a follow-up visit. Patient educated on when it is appropriate to go to the emergency department.   [name], FNP

## 2019-10-13 ENCOUNTER — Telehealth: Payer: Self-pay

## 2019-10-13 NOTE — Telephone Encounter (Signed)
Left message to call back  

## 2019-10-13 NOTE — Telephone Encounter (Signed)
Keep stool soft (miralax, colace, etc). Use hemorrhoid cream.   If continues to have BRB, needs to be seen ASAP.

## 2019-10-13 NOTE — Telephone Encounter (Signed)
Patient had bright red blood with a bowel movement this AM. Wife is worried because he is taking xarelto but states that he is also having a lot of trouble with BMs. What should they do? Please advise Patient of MMM

## 2019-10-17 ENCOUNTER — Ambulatory Visit (INDEPENDENT_AMBULATORY_CARE_PROVIDER_SITE_OTHER): Payer: BC Managed Care – PPO

## 2019-10-17 ENCOUNTER — Ambulatory Visit (INDEPENDENT_AMBULATORY_CARE_PROVIDER_SITE_OTHER): Payer: BC Managed Care – PPO | Admitting: Family Medicine

## 2019-10-17 ENCOUNTER — Other Ambulatory Visit: Payer: Self-pay

## 2019-10-17 ENCOUNTER — Encounter: Payer: Self-pay | Admitting: Family Medicine

## 2019-10-17 VITALS — BP 99/72 | HR 83 | Temp 97.7°F | Ht 71.0 in | Wt 253.2 lb

## 2019-10-17 DIAGNOSIS — R0600 Dyspnea, unspecified: Secondary | ICD-10-CM | POA: Diagnosis not present

## 2019-10-17 DIAGNOSIS — K921 Melena: Secondary | ICD-10-CM | POA: Diagnosis not present

## 2019-10-17 DIAGNOSIS — R0609 Other forms of dyspnea: Secondary | ICD-10-CM

## 2019-10-17 DIAGNOSIS — I2692 Saddle embolus of pulmonary artery without acute cor pulmonale: Secondary | ICD-10-CM

## 2019-10-17 DIAGNOSIS — R5383 Other fatigue: Secondary | ICD-10-CM | POA: Diagnosis not present

## 2019-10-17 DIAGNOSIS — J189 Pneumonia, unspecified organism: Secondary | ICD-10-CM | POA: Diagnosis not present

## 2019-10-17 DIAGNOSIS — R0602 Shortness of breath: Secondary | ICD-10-CM | POA: Diagnosis not present

## 2019-10-17 DIAGNOSIS — R21 Rash and other nonspecific skin eruption: Secondary | ICD-10-CM | POA: Diagnosis not present

## 2019-10-17 LAB — HEMOGLOBIN, FINGERSTICK: Hemoglobin: 12.5 g/dL — ABNORMAL LOW (ref 12.6–17.7)

## 2019-10-17 MED ORDER — ALBUTEROL SULFATE HFA 108 (90 BASE) MCG/ACT IN AERS: 1.0000 | INHALATION_SPRAY | RESPIRATORY_TRACT | 1 refills | Status: DC | PRN

## 2019-10-17 NOTE — Patient Instructions (Signed)
Pulmonary Embolism  A pulmonary embolism (PE) is a sudden blockage or decrease of blood flow in one or both lungs. Most blockages come from a blood clot that forms in the vein of a lower leg, thigh, or arm (deep vein thrombosis, DVT) and travels to the lungs. A clot is blood that has thickened into a gel or solid. PE is a dangerous and life-threatening condition that needs to be treated right away. What are the causes? This condition is usually caused by a blood clot that forms in a vein and moves to the lungs. In rare cases, it may be caused by air, fat, part of a tumor, or other tissue that moves through the veins and into the lungs. What increases the risk? The following factors may make you more likely to develop this condition:  Experiencing a traumatic injury, such as breaking a hip or leg.  Having: ? A spinal cord injury. ? Orthopedic surgery, especially hip or knee replacement. ? Any major surgery. ? A stroke. ? DVT. ? Blood clots or blood clotting disease. ? Long-term (chronic) lung or heart disease. ? Cancer treated with chemotherapy. ? A central venous catheter.  Taking medicines that contain estrogen. These include birth control pills and hormone replacement therapy.  Being: ? Pregnant. ? In the period of time after your baby is delivered (postpartum). ? Older than age 60. ? Overweight. ? A smoker, especially if you have other risks. What are the signs or symptoms? Symptoms of this condition usually start suddenly and include:  Shortness of breath during activity or at rest.  Coughing, coughing up blood, or coughing up blood-tinged mucus.  Chest pain that is often worse with deep breaths.  Rapid or irregular heartbeat.  Feeling light-headed or dizzy.  Fainting.  Feeling anxious.  Fever.  Sweating.  Pain and swelling in a leg. This is a symptom of DVT, which can lead to PE. How is this diagnosed? This condition may be diagnosed based on:  Your medical  history.  A physical exam.  Blood tests.  CT pulmonary angiogram. This test checks blood flow in and around your lungs.  Ventilation-perfusion scan, also called a lung VQ scan. This test measures air flow and blood flow to the lungs.  An ultrasound of the legs. How is this treated? Treatment for this condition depends on many factors, such as the cause of your PE, your risk for bleeding or developing more clots, and other medical conditions you have. Treatment aims to remove, dissolve, or stop blood clots from forming or growing larger. Treatment may include:  Medicines, such as: ? Blood thinning medicines (anticoagulants) to stop clots from forming. ? Medicines that dissolve clots (thrombolytics).  Procedures, such as: ? Using a flexible tube to remove a blood clot (embolectomy) or to deliver medicine to destroy it (catheter-directed thrombolysis). ? Inserting a filter into a large vein that carries blood to the heart (inferior vena cava). This filter (vena cava filter) catches blood clots before they reach the lungs. ? Surgery to remove the clot (surgical embolectomy). This is rare. You may need a combination of immediate, long-term (up to 3 months after diagnosis), and extended (more than 3 months after diagnosis) treatments. Your treatment may continue for several months (maintenance therapy). You and your health care provider will work together to choose the treatment program that is best for you. Follow these instructions at home: Medicines  Take over-the-counter and prescription medicines only as told by your health care provider.  If you   are taking an anticoagulant medicine: ? Take the medicine every day at the same time each day. ? Understand what foods and drugs interact with your medicine. ? Understand the side effects of this medicine, including excessive bruising or bleeding. Ask your health care provider or pharmacist about other side effects. General  instructions  Wear a medical alert bracelet or carry a medical alert card that says you have had a PE and lists what medicines you take.  Ask your health care provider when you may return to your normal activities. Avoid sitting or lying for a long time without moving.  Maintain a healthy weight. Ask your health care provider what weight is healthy for you.  Do not use any products that contain nicotine or tobacco, such as cigarettes, e-cigarettes, and chewing tobacco. If you need help quitting, ask your health care provider.  Talk with your health care provider about any travel plans. It is important to make sure that you are still able to take your medicine while on trips.  Keep all follow-up visits as told by your health care provider. This is important. Contact a health care provider if:  You missed a dose of your blood thinner medicine. Get help right away if:  You have: ? New or increased pain, swelling, warmth, or redness in an arm or leg. ? Numbness or tingling in an arm or leg. ? Shortness of breath during activity or at rest. ? A fever. ? Chest pain. ? A rapid or irregular heartbeat. ? A severe headache. ? Vision changes. ? A serious fall or accident, or you hit your head. ? Stomach (abdominal) pain. ? Blood in your vomit, stool, or urine. ? A cut that will not stop bleeding.  You cough up blood.  You feel light-headed or dizzy.  You cannot move your arms or legs.  You are confused or have memory loss. These symptoms may represent a serious problem that is an emergency. Do not wait to see if the symptoms will go away. Get medical help right away. Call your local emergency services (911 in the [place] Do not drive yourself to the hospital. Summary  A pulmonary embolism (PE) is a sudden blockage or decrease of blood flow in one or both lungs. PE is a dangerous and life-threatening condition that needs to be treated right away.  Treatments for this condition usually  include medicines to thin your blood (anticoagulants) or medicines to break apart blood clots (thrombolytics).  If you are given blood thinners, it is important to take the medicine every day at the same time each day.  Understand what foods and drugs interact with any medicines that you are taking.  If you have signs of PE or DVT, call your local emergency services (911 in the [place] This information is not intended to replace advice given to you by your health care provider. Make sure you discuss any questions you have with your health care provider. Document Revised: 09/29/2017 Document Reviewed: 09/29/2017 Elsevier Patient Education  2020 [employer]  

## 2019-10-17 NOTE — Progress Notes (Signed)
 Subjective: CC: Covid-19, Acute saddle pulmonary embolism follow up, blood in stool PCP: Martin, Mary-Margaret, FNP  HPI:Shawn [name] is a 49 y.o. male presenting to clinic today for follow up of Covid 19 and saddle pulmonary embolism.   He is currently on oxygen at 3-4L. He hasn't noticed any improvement with his dyspnea on exertion but he does admit to tolerating more activity than previously. He has been using his wife's albuterol inhaler 1-2x a day with improvement of symptoms. He is coughing  up phlegm more than usual. The phlegm is clear and thin, it was previously white. No change in cough in frequency of cough. It continues to be worse with exertion. He has noticed more fatigue. He also has started to have swelling in his feet and ankles and again. He denies chest pain, worsening shortness of breath, dizziness, palpitations, lightheadedness, or fever.  New problems include: Blood in stool He reports bright red blood in stool for 5 days. It was constipated at the time and was straining with BMs. He has samples of Linzess that he started taking that has relieves his constipation. He continues to have bright red blood with the loose stools. He also reports increased fatigue. Denies rectal pain or itching, abdominal pain, nausea or vomiting. He has seen GI in the past for hemorrhoids and has had a colonoscopy with removal of polyps. Previously saw GI in [place] but would prefer a [place] location.    Relevant past medical, surgical, family, and social history reviewed and updated as indicated.  Allergies and medications reviewed and updated.  Allergies  Allergen Reactions  . Codeine Other (See Comments)    HALLUCINATIONS   No past medical history on file.  Current Outpatient Medications:  .  ascorbic acid (VITAMIN C) 500 MG tablet, Take 1 tablet (500 mg total) by mouth daily., Disp: , Rfl:  .  clonazePAM (KLONOPIN) 0.5 MG tablet, Take 1 tablet (0.5 mg total) by mouth 2 (two) times  daily as needed for anxiety., Disp: 20 tablet, Rfl: 1 .  MELATONIN PO, Take by mouth., Disp: , Rfl:  .  RIVAROXABAN (XARELTO) VTE STARTER PACK (15 & 20 MG TABLETS), Follow package directions: Take one 15mg tablet by mouth twice a day. On day 22, switch to one 20mg tablet once a day. Take with food., Disp: 51 each, Rfl: 0 .  VITAMIN D PO, Take by mouth., Disp: , Rfl:  .  zinc sulfate 220 (50 Zn) MG capsule, Take 1 capsule (220 mg total) by mouth daily., Disp: , Rfl:  Social History   Socioeconomic History  . Marital status: Married    Spouse name: Not on file  . Number of children: Not on file  . Years of education: Not on file  . Highest education level: Not on file  Occupational History  . Not on file  Tobacco Use  . Smoking status: Former Smoker    Packs/day: 0.25    Years: 15.00    Pack years: 3.75  . Smokeless tobacco: Never Used  Substance and Sexual Activity  . Alcohol use: Yes  . Drug use: No  . Sexual activity: Yes    Birth control/protection: None  Other Topics Concern  . Not on file  Social History Narrative  . Not on file   Social Determinants of Health   Financial Resource Strain:   . Difficulty of Paying Living Expenses: Not on file  Food Insecurity:   . Worried About Running Out of Food in the Last Year:   Not on file  . Ran Out of Food in the Last Year: Not on file  Transportation Needs:   . Lack of Transportation (Medical): Not on file  . Lack of Transportation (Non-Medical): Not on file  Physical Activity:   . Days of Exercise per Week: Not on file  . Minutes of Exercise per Session: Not on file  Stress:   . Feeling of Stress : Not on file  Social Connections:   . Frequency of Communication with Friends and Family: Not on file  . Frequency of Social Gatherings with Friends and Family: Not on file  . Attends Religious Services: Not on file  . Active Member of Clubs or Organizations: Not on file  . Attends Club or Organization Meetings: Not on file  .  Marital Status: Not on file  Intimate Partner Violence:   . Fear of Current or Ex-Partner: Not on file  . Emotionally Abused: Not on file  . Physically Abused: Not on file  . Sexually Abused: Not on file   Family History  Problem Relation Age of Onset  . Diabetes Maternal Grandmother   . Alzheimer's disease Paternal Grandmother   . Cancer Paternal Grandfather        THROAT    Review of Systems  Per HPI.    Office Visit from 10/11/2019 in [facility]  PHQ-2 Total Score 0      Objective: Office vital signs reviewed. BP 99/72   Pulse 83   Temp 97.7 F (36.5 C) (Temporal)   Ht 5' 11" (1.803 m)   Wt 253 lb 4 oz (114.9 kg)   SpO2 98% Comment: on 4 liters via [place]  BMI 35.32 kg/m   Physical Examination:  Physical Exam Vitals and nursing note reviewed.  Constitutional:      General: He is not in acute distress.    Appearance: He is not ill-appearing or diaphoretic.  Cardiovascular:     Rate and Rhythm: Normal rate and regular rhythm.     Heart sounds: Normal heart sounds. No murmur heard.  No friction rub. No gallop.   Pulmonary:     Effort: Pulmonary effort is normal.     Breath sounds: Examination of the right-lower field reveals decreased breath sounds. Examination of the left-lower field reveals decreased breath sounds. Decreased breath sounds present. No wheezing, rhonchi or rales.  Chest:     Chest wall: No mass or tenderness.  Abdominal:     General: Bowel sounds are normal.     Palpations: Abdomen is soft. There is no mass.     Tenderness: There is no abdominal tenderness. There is no guarding or rebound.  Genitourinary:    Rectum: Normal. No anal fissure or internal hemorrhoid.  Musculoskeletal:     Right lower leg: No edema.     Left lower leg: No edema.  Skin:    General: Skin is dry.     Capillary Refill: Capillary refill takes less than 2 seconds.  Neurological:     General: No focal deficit present.     Mental Status: He is  alert and oriented to person, place, and time.  Psychiatric:        Mood and Affect: Mood normal.        Behavior: Behavior normal.     Results for orders placed or performed during the hospital encounter of 09/27/19  Blood Culture (routine x 2)   Specimen: Site Not Specified; Blood  Result Value Ref Range   Specimen   Description      SITE NOT SPECIFIED Performed at [facility] [address] [place], [place] [address]    Special Requests      BOTTLES DRAWN AEROBIC AND ANAEROBIC Blood Culture results may not be optimal due to an excessive volume of blood received in culture bottles Performed at [facility] [address] [place], [place] [address]    Culture      NO GROWTH 5 DAYS Performed at [facility], [place] [address] [place], [place] [address]    Report Status 10/02/2019 FINAL   Blood Culture (routine x 2)   Specimen: BLOOD  Result Value Ref Range   Specimen Description      BLOOD SITE NOT SPECIFIED Performed at [facility], [address], [place], [place] [address]    Special Requests      BOTTLES DRAWN AEROBIC AND ANAEROBIC Blood Culture adequate volume Performed at [facility] [address] [place], [place] [address]    Culture      NO GROWTH 5 DAYS Performed at [facility], [place] [address] [place], [place] [address]    Report Status 10/02/2019 FINAL   Lactic acid, plasma  Result Value Ref Range   Lactic Acid, Venous 1.1 0.5 - 1.9 mmol/L  Lactic acid, plasma  Result Value Ref Range   Lactic Acid, Venous 1.0 0.5 - 1.9 mmol/L  CBC WITH DIFFERENTIAL  Result Value Ref Range   WBC 10.4 4.0 - 10.5 K/uL   RBC 4.87 4.22 - 5.81 MIL/uL   Hemoglobin 14.6 13.0 - 17.0 g/dL   HCT 43.9 39 - 52 %   MCV 90.1 80.0 - 100.0 fL   MCH 30.0 26.0 - 34.0 pg   MCHC 33.3 30.0 - 36.0 g/dL   RDW 13.2 11.5 - 15.5 %   Platelets 127 (L) 150 - 400 K/uL   nRBC 0.0 0.0 - 0.2 %   Neutrophils Relative %  80 %   Neutro Abs 8.3 (H) 1.7 - 7.7 K/uL   Lymphocytes Relative 8 %   Lymphs Abs 0.8 0.7 - 4.0 K/uL   Monocytes Relative 9 %   Monocytes Absolute 1.0 0.1 - 1.0 K/uL   Eosinophils Relative 2 %   Eosinophils Absolute 0.2 0 - 0 K/uL   Basophils Relative 0 %   Basophils Absolute 0.0 0 - 0 K/uL   Immature Granulocytes 1 %   Abs Immature Granulocytes 0.15 (H) 0.00 - 0.07 K/uL  Comprehensive metabolic panel  Result Value Ref Range   Sodium 137 135 - 145 mmol/L   Potassium 4.1 3.5 - 5.1 mmol/L   Chloride 101 98 - 111 mmol/L   CO2 26 22 - 32 mmol/L   Glucose, Bld 74 70 - 99 mg/dL   BUN 30 (H) 6 - 20 mg/dL   Creatinine, Ser 1.11 0.61 - 1.24 mg/dL   Calcium 8.2 (L) 8.9 - 10.3 mg/dL   Total Protein 5.6 (L) 6.5 - 8.1 g/dL   Albumin 2.7 (L) 3.5 - 5.0 g/dL   AST 42 (H) 15 - 41 U/L   ALT 123 (H) 0 - 44 U/L   Alkaline Phosphatase 69 38 - 126 U/L   Total Bilirubin 0.8 0.3 - 1.2 mg/dL   GFR calc non Af Amer >60 >60 mL/min   GFR calc Af Amer >60 >60 mL/min   Anion gap 10 5 - 15  D-dimer, quantitative  Result Value Ref Range  D-Dimer, Quant >20.00 (H) 0.00 - 0.50 ug/mL-FEU  Procalcitonin  Result Value Ref Range   Procalcitonin <0.10 ng/mL  Lactate dehydrogenase  Result Value Ref Range   LDH 338 (H) 98 - 192 U/L  Ferritin  Result Value Ref Range   Ferritin 1,184 (H) 24 - 336 ng/mL  Triglycerides  Result Value Ref Range   Triglycerides 225 (H) <150 mg/dL  Fibrinogen  Result Value Ref Range   Fibrinogen 247 210 - 475 mg/dL  C-reactive protein  Result Value Ref Range   CRP 3.2 (H) <1.0 mg/dL  Heparin level (unfractionated)  Result Value Ref Range   Heparin Unfractionated 0.84 (H) 0.30 - 0.70 IU/mL  Magnesium  Result Value Ref Range   Magnesium 2.2 1.7 - 2.4 mg/dL  CBC WITH DIFFERENTIAL  Result Value Ref Range   WBC 8.1 4.0 - 10.5 K/uL   RBC 4.26 4.22 - 5.81 MIL/uL   Hemoglobin 12.9 (L) 13.0 - 17.0 g/dL   HCT 39.2 39 - 52 %   MCV 92.0 80.0 - 100.0 fL   MCH 30.3 26.0 - 34.0 pg     MCHC 32.9 30.0 - 36.0 g/dL   RDW 13.4 11.5 - 15.5 %   Platelets 106 (L) 150 - 400 K/uL   nRBC 0.0 0.0 - 0.2 %   Neutrophils Relative % 78 %   Neutro Abs 6.3 1.7 - 7.7 K/uL   Lymphocytes Relative 9 %   Lymphs Abs 0.8 0.7 - 4.0 K/uL   Monocytes Relative 9 %   Monocytes Absolute 0.7 0.1 - 1.0 K/uL   Eosinophils Relative 3 %   Eosinophils Absolute 0.2 0 - 0 K/uL   Basophils Relative 0 %   Basophils Absolute 0.0 0 - 0 K/uL   Immature Granulocytes 1 %   Abs Immature Granulocytes 0.11 (H) 0.00 - 0.07 K/uL  Comprehensive metabolic panel  Result Value Ref Range   Sodium 140 135 - 145 mmol/L   Potassium 4.0 3.5 - 5.1 mmol/L   Chloride 101 98 - 111 mmol/L   CO2 30 22 - 32 mmol/L   Glucose, Bld 123 (H) 70 - 99 mg/dL   BUN 28 (H) 6 - 20 mg/dL   Creatinine, Ser 1.12 0.61 - 1.24 mg/dL   Calcium 8.2 (L) 8.9 - 10.3 mg/dL   Total Protein 4.9 (L) 6.5 - 8.1 g/dL   Albumin 2.5 (L) 3.5 - 5.0 g/dL   AST 27 15 - 41 U/L   ALT 98 (H) 0 - 44 U/L   Alkaline Phosphatase 61 38 - 126 U/L   Total Bilirubin 0.7 0.3 - 1.2 mg/dL   GFR calc non Af Amer >60 >60 mL/min   GFR calc Af Amer >60 >60 mL/min   Anion gap 9 5 - 15  D-dimer, quantitative (not at [facility])  Result Value Ref Range   D-Dimer, Quant >20.00 (H) 0.00 - 0.50 ug/mL-FEU  Lactate dehydrogenase  Result Value Ref Range   LDH 278 (H) 98 - 192 U/L  Ferritin  Result Value Ref Range   Ferritin 723 (H) 24 - 336 ng/mL  Heparin level (unfractionated)  Result Value Ref Range   Heparin Unfractionated 0.75 (H) 0.30 - 0.70 IU/mL  Heparin level (unfractionated)  Result Value Ref Range   Heparin Unfractionated 0.48 0.30 - 0.70 IU/mL  Comprehensive metabolic panel  Result Value Ref Range   Sodium 134 (L) 135 - 145 mmol/L   Potassium 4.0 3.5 - 5.1 mmol/L   Chloride 101 98 - 111   mmol/L   CO2 24 22 - 32 mmol/L   Glucose, Bld 98 70 - 99 mg/dL   BUN 20 6 - 20 mg/dL   Creatinine, Ser 0.88 0.61 - 1.24 mg/dL   Calcium 7.8 (L) 8.9 - 10.3 mg/dL   Total  Protein 5.1 (L) 6.5 - 8.1 g/dL   Albumin 2.4 (L) 3.5 - 5.0 g/dL   AST 24 15 - 41 U/L   ALT 80 (H) 0 - 44 U/L   Alkaline Phosphatase 62 38 - 126 U/L   Total Bilirubin 0.8 0.3 - 1.2 mg/dL   GFR calc non Af Amer >60 >60 mL/min   GFR calc Af Amer >60 >60 mL/min   Anion gap 9 5 - 15  CBC  Result Value Ref Range   WBC 6.9 4.0 - 10.5 K/uL   RBC 4.14 (L) 4.22 - 5.81 MIL/uL   Hemoglobin 12.7 (L) 13.0 - 17.0 g/dL   HCT 38.4 (L) 39 - 52 %   MCV 92.8 80.0 - 100.0 fL   MCH 30.7 26.0 - 34.0 pg   MCHC 33.1 30.0 - 36.0 g/dL   RDW 13.2 11.5 - 15.5 %   Platelets 104 (L) 150 - 400 K/uL   nRBC 0.0 0.0 - 0.2 %  D-dimer, quantitative (not at [facility])  Result Value Ref Range   D-Dimer, Quant 15.08 (H) 0.00 - 0.50 ug/mL-FEU  C-reactive protein  Result Value Ref Range   CRP 5.5 (H) <1.0 mg/dL  Heparin level (unfractionated)  Result Value Ref Range   Heparin Unfractionated 0.55 0.30 - 0.70 IU/mL  Heparin level (unfractionated)  Result Value Ref Range   Heparin Unfractionated 0.66 0.30 - 0.70 IU/mL  Comprehensive metabolic panel  Result Value Ref Range   Sodium 136 135 - 145 mmol/L   Potassium 4.1 3.5 - 5.1 mmol/L   Chloride 103 98 - 111 mmol/L   CO2 24 22 - 32 mmol/L   Glucose, Bld 98 70 - 99 mg/dL   BUN 15 6 - 20 mg/dL   Creatinine, Ser 0.98 0.61 - 1.24 mg/dL   Calcium 8.0 (L) 8.9 - 10.3 mg/dL   Total Protein 5.5 (L) 6.5 - 8.1 g/dL   Albumin 2.8 (L) 3.5 - 5.0 g/dL   AST 21 15 - 41 U/L   ALT 62 (H) 0 - 44 U/L   Alkaline Phosphatase 62 38 - 126 U/L   Total Bilirubin 0.5 0.3 - 1.2 mg/dL   GFR calc non Af Amer >60 >60 mL/min   GFR calc Af Amer >60 >60 mL/min   Anion gap 9 5 - 15  CBC  Result Value Ref Range   WBC 6.5 4.0 - 10.5 K/uL   RBC 4.13 (L) 4.22 - 5.81 MIL/uL   Hemoglobin 12.7 (L) 13.0 - 17.0 g/dL   HCT 37.7 (L) 39 - 52 %   MCV 91.3 80.0 - 100.0 fL   MCH 30.8 26.0 - 34.0 pg   MCHC 33.7 30.0 - 36.0 g/dL   RDW 13.2 11.5 - 15.5 %   Platelets 94 (L) 150 - 400 K/uL   nRBC 0.0 0.0 -  0.2 %  D-dimer, quantitative (not at [facility])  Result Value Ref Range   D-Dimer, Quant 7.12 (H) 0.00 - 0.50 ug/mL-FEU  Heparin level (unfractionated)  Result Value Ref Range   Heparin Unfractionated 0.47 0.30 - 0.70 IU/mL  ECHOCARDIOGRAM COMPLETE  Result Value Ref Range   Weight 4,080 oz   Height 71 in   BP 110/58 mmHg     S' Lateral 2.60 cm   Area-P 1/2 3.37 cm2  Troponin I (High Sensitivity)  Result Value Ref Range   Troponin I (High Sensitivity) 8 <18 ng/L  Troponin I (High Sensitivity)  Result Value Ref Range   Troponin I (High Sensitivity) 7 <18 ng/L     Assessment/ Plan: Godfrey was seen today for shortness of breath.  Diagnoses and all orders for this visit:  Acute saddle pulmonary embolism without acute cor pulmonale ([place]) Labs pending as below. Continue Xarelto.  -     D-dimer, quantitative (not at [facility]) -     CMP14+EGFR -     DG Chest 2 View  Fatigue, unspecified type BNP to assess for heart failure. CBC to assess for anemia.  -     Pro b natriuretic peptide -     CBC with Diff/platelet  Dyspnea on exertion Chest Xray with improved expansion in comparison to previous Xray. Lungs remain hazy. No cardiomyopathy noted. Radiology report pending. Albuterol as need. Continue 3-4L of oxygen.  -     albuterol (VENTOLIN HFA) 108 (90 Base) MCG/ACT inhaler; Inhale 1-2 puffs into the lungs every 4 (four) hours as needed for wheezing or shortness of breath. -     Pro b natriuretic peptide -     DG Chest 2 View  Blood in stool, frank No hemorrhoids or fissures noted on exam. Hgb 12.5 in office today. BP is on low end at 99/72. HR stable. Patient is on Xarelto and has a history of polyps. Urgent referral to GI placed. Strict instruction given to RTO for new or worsening symptoms.  -     Hemoglobin, fingerstick -     CBC with Differential/Platelet -     Ambulatory referral to Gastroenterology  Work note provided today.   Follow up in 1 week for PE and Covid recovery.  The  above assessment and management plan was discussed with the patient. The patient verbalized understanding of and has agreed to the management plan. Patient is aware to call the clinic if symptoms persist or worsen. Patient is aware when to return to the clinic for a follow-up visit. Patient educated on when it is appropriate to go to the emergency department.   Marjorie Smolder, FNP-C [facility] [address] [place], [place] [address] [phone]

## 2019-10-18 LAB — CBC WITH DIFFERENTIAL/PLATELET
Basophils Absolute: 0.1 10*3/uL (ref 0.0–0.2)
Basos: 1 %
EOS (ABSOLUTE): 0.4 10*3/uL (ref 0.0–0.4)
Eos: 5 %
Hematocrit: 38.7 % (ref 37.5–51.0)
Hemoglobin: 13.1 g/dL (ref 13.0–17.7)
Immature Grans (Abs): 0.1 10*3/uL (ref 0.0–0.1)
Immature Granulocytes: 1 %
Lymphocytes Absolute: 1.2 10*3/uL (ref 0.7–3.1)
Lymphs: 15 %
MCH: 30.2 pg (ref 26.6–33.0)
MCHC: 33.9 g/dL (ref 31.5–35.7)
MCV: 89 fL (ref 79–97)
Monocytes Absolute: 0.8 10*3/uL (ref 0.1–0.9)
Monocytes: 11 %
Neutrophils Absolute: 5.1 10*3/uL (ref 1.4–7.0)
Neutrophils: 67 %
Platelets: 324 10*3/uL (ref 150–450)
RBC: 4.34 x10E6/uL (ref 4.14–5.80)
RDW: 12.8 % (ref 11.6–15.4)
WBC: 7.6 10*3/uL (ref 3.4–10.8)

## 2019-10-18 LAB — CMP14+EGFR
ALT: 30 IU/L (ref 0–44)
AST: 20 IU/L (ref 0–40)
Albumin/Globulin Ratio: 1.4 (ref 1.2–2.2)
Albumin: 3.7 g/dL — ABNORMAL LOW (ref 4.0–5.0)
Alkaline Phosphatase: 102 IU/L (ref 44–121)
BUN/Creatinine Ratio: 7 — ABNORMAL LOW (ref 9–20)
BUN: 7 mg/dL (ref 6–24)
Bilirubin Total: 0.3 mg/dL (ref 0.0–1.2)
CO2: 27 mmol/L (ref 20–29)
Calcium: 9.1 mg/dL (ref 8.7–10.2)
Chloride: 101 mmol/L (ref 96–106)
Creatinine, Ser: 1 mg/dL (ref 0.76–1.27)
GFR calc Af Amer: 102 mL/min/{1.73_m2} (ref >59)
GFR calc non Af Amer: 88 mL/min/{1.73_m2} (ref >59)
Globulin, Total: 2.6 g/dL (ref 1.5–4.5)
Glucose: 107 mg/dL — ABNORMAL HIGH (ref 65–99)
Potassium: 4.1 mmol/L (ref 3.5–5.2)
Sodium: 140 mmol/L (ref 134–144)
Total Protein: 6.3 g/dL (ref 6.0–8.5)

## 2019-10-18 LAB — D-DIMER, QUANTITATIVE: D-DIMER: 1.01 mg/L FEU — ABNORMAL HIGH (ref 0.00–0.49)

## 2019-10-19 DIAGNOSIS — U071 COVID-19: Secondary | ICD-10-CM | POA: Diagnosis not present

## 2019-10-19 LAB — SPECIMEN STATUS REPORT

## 2019-10-19 LAB — PRO B NATRIURETIC PEPTIDE: NT-Pro BNP: 100 pg/mL (ref 0–121)

## 2019-10-21 DIAGNOSIS — U071 COVID-19: Secondary | ICD-10-CM | POA: Diagnosis not present

## 2019-10-23 ENCOUNTER — Encounter: Payer: Self-pay | Admitting: Gastroenterology

## 2019-10-24 ENCOUNTER — Other Ambulatory Visit: Payer: Self-pay

## 2019-10-24 ENCOUNTER — Ambulatory Visit (INDEPENDENT_AMBULATORY_CARE_PROVIDER_SITE_OTHER): Payer: BC Managed Care – PPO | Admitting: Family Medicine

## 2019-10-24 ENCOUNTER — Encounter: Payer: Self-pay | Admitting: Family Medicine

## 2019-10-24 VITALS — BP 122/79 | HR 89 | Temp 99.3°F | Resp 20 | Ht 71.0 in | Wt 259.5 lb

## 2019-10-24 DIAGNOSIS — Z029 Encounter for administrative examinations, unspecified: Secondary | ICD-10-CM

## 2019-10-24 DIAGNOSIS — R0609 Other forms of dyspnea: Secondary | ICD-10-CM

## 2019-10-24 DIAGNOSIS — R0600 Dyspnea, unspecified: Secondary | ICD-10-CM | POA: Diagnosis not present

## 2019-10-24 DIAGNOSIS — K921 Melena: Secondary | ICD-10-CM | POA: Diagnosis not present

## 2019-10-24 DIAGNOSIS — I2692 Saddle embolus of pulmonary artery without acute cor pulmonale: Secondary | ICD-10-CM

## 2019-10-24 MED ORDER — RIVAROXABAN 20 MG PO TABS: 20.0000 mg | ORAL_TABLET | Freq: Every day | ORAL | 1 refills | Status: DC

## 2019-10-24 NOTE — Progress Notes (Signed)
Subjective: CC: one week follow up PCP: Shawn Pretty, FNP  [name] is a 49 y.o. male presenting to clinic today for:  1. Dyspnea on exertion [name] reports some slight improvement in dyspnea on exertion. He is still winded walking to his bathroom. However, he has mostly been on 3L pf oxygen this week. He does increase to 4L if feeling short of breath. He denies edema, chest pain, or dizziness.   2. Blood in stool Shawn has not seen blood in his stool any this past week. He is having daily BMs, but they continue to be he hard stool. He did start stool softeners today and is taking fiber supplements. He does admit to not drinking as much fluid as before. He has an appt with GI on 11/10/19. Denies dizziness, lightheadedness, nausea, vomiting, or abdominal pain.   Relevant past medical, surgical, family, and social history reviewed and updated as indicated.  Allergies and medications reviewed and updated.  Allergies  Allergen Reactions  . Ativan [Lorazepam] Other (See Comments)    Agitation, aggressive  . Codeine Other (See Comments)    HALLUCINATIONS   History reviewed.   Current Outpatient Medications:  .  albuterol (VENTOLIN HFA) 108 (90 Base) MCG/ACT inhaler, Inhale 1-2 puffs into the lungs every 4 (four) hours as needed for wheezing or shortness of breath., Disp: 8 g, Rfl: 1 .  ascorbic acid (VITAMIN C) 500 MG tablet, Take 1 tablet (500 mg total) by mouth daily., Disp: , Rfl:  .  clonazePAM (KLONOPIN) 0.5 MG tablet, Take 1 tablet (0.5 mg total) by mouth 2 (two) times daily as needed for anxiety., Disp: 20 tablet, Rfl: 1 .  MELATONIN PO, Take by mouth., Disp: , Rfl:  .  RIVAROXABAN (XARELTO) VTE STARTER PACK (15 & 20 MG TABLETS), Follow package directions: Take one 75m tablet by mouth twice a day. On day 22, switch to one 267mtablet once a day. Take with food., Disp: 51 each, Rfl: 0 .  VITAMIN D PO, Take by mouth., Disp: , Rfl:  .  zinc sulfate 220 (50 Zn) MG  capsule, Take 1 capsule (220 mg total) by mouth daily., Disp: , Rfl:  Social History   Socioeconomic History  . Marital status: Married    Spouse name: Not on file  . Number of children: Not on file  . Years of education: Not on file  . Highest education level: Not on file  Occupational History  . Not on file  Tobacco Use  . Smoking status: Former Smoker    Packs/day: 0.25    Years: 15.00    Pack years: 3.75  . Smokeless tobacco: Never Used  Substance and Sexual Activity  . Alcohol use: Yes  . Drug use: No  . Sexual activity: Yes    Birth control/protection: None  Other Topics Concern  . Not on file  Social History Narrative  . Not on file   Social Determinants of Health   Financial Resource Strain:   . Difficulty of Paying Living Expenses: Not on file  Food Insecurity:   . Worried About RuCharity [name] the Last Year: Not on file  . Ran Out of Food in the Last Year: Not on file  Transportation Needs:   . Lack of Transportation (Medical): Not on file  . Lack of Transportation (Non-Medical): Not on file  Physical Activity:   . Days of Exercise per Week: Not on file  . Minutes of Exercise per Session: Not on file  Stress:   . Feeling of Stress : Not on file  Social Connections:   . Frequency of Communication with Friends and Family: Not on file  . Frequency of Social Gatherings with Friends and Family: Not on file  . Attends Religious Services: Not on file  . Active Member of Clubs or Organizations: Not on file  . Attends Archivist Meetings: Not on file  . Marital Status: Not on file  Intimate Partner Violence:   . Fear of Current or Ex-Partner: Not on file  . Emotionally Abused: Not on file  . Physically Abused: Not on file  . Sexually Abused: Not on file   Family History  Problem Relation Age of Onset  . Diabetes Maternal Grandmother   . Alzheimer's disease Paternal Grandmother   . Cancer Paternal Grandfather        THROAT    Review of  Systems  Per HPI.   Objective: Office vital signs reviewed. BP 122/79   Pulse 89   Temp 99.3 F (37.4 C) (Temporal)   Resp 20   Ht $R'5\' 11"'MO$  (1.803 m)   Wt 259 lb 8 oz (117.7 kg)   SpO2 96%   BMI 36.19 kg/m   Physical Examination:  Physical Exam Vitals and nursing note reviewed.  Constitutional:      General: He is not in acute distress.    Appearance: Normal appearance. He is not ill-appearing, toxic-appearing or diaphoretic.  Cardiovascular:     Rate and Rhythm: Normal rate and regular rhythm.     Heart sounds: Normal heart sounds. No murmur heard.   Pulmonary:     Effort: Pulmonary effort is normal. No respiratory distress.     Breath sounds: Examination of the right-lower field reveals decreased breath sounds. Examination of the left-lower field reveals decreased breath sounds. Decreased breath sounds present.  Musculoskeletal:     Right lower leg: No edema.     Left lower leg: No edema.  Skin:    General: Skin is warm and dry.  Neurological:     General: No focal deficit present.     Mental Status: He is alert and oriented to person, place, and time.  Psychiatric:        Mood and Affect: Mood normal.        Behavior: Behavior normal.     Results for orders placed or performed in visit on 10/17/19  D-dimer, quantitative (not at [facility])  Result Value Ref Range   D-DIMER 1.01 (H) 0.00 - 0.49 mg/L FEU  CMP14+EGFR  Result Value Ref Range   Glucose 107 (H) 65 - 99 mg/dL   BUN 7 6 - 24 mg/dL   Creatinine, Ser 1.00 0.76 - 1.27 mg/dL   GFR calc non Af Amer 88 >59 mL/min/1.73   GFR calc Af Amer 102 >59 mL/min/1.73   BUN/Creatinine Ratio 7 (L) 9 - 20   Sodium 140 134 - 144 mmol/L   Potassium 4.1 3.5 - 5.2 mmol/L   Chloride 101 96 - 106 mmol/L   CO2 27 20 - 29 mmol/L   Calcium 9.1 8.7 - 10.2 mg/dL   Total Protein 6.3 6.0 - 8.5 g/dL   Albumin 3.7 (L) 4.0 - 5.0 g/dL   Globulin, Total 2.6 1.5 - 4.5 g/dL   Albumin/Globulin Ratio 1.4 1.2 - 2.2   Bilirubin Total 0.3 0.0  - 1.2 mg/dL   Alkaline Phosphatase 102 44 - 121 IU/L   AST 20 0 - 40 IU/L   ALT 30  0 - 44 IU/L  Hemoglobin, fingerstick  Result Value Ref Range   Hemoglobin 12.5 (L) 12.6 - 17.7 g/dL  CBC with Differential/Platelet  Result Value Ref Range   WBC 7.6 3.4 - 10.8 x10E3/uL   RBC 4.34 4.14 - 5.80 x10E6/uL   Hemoglobin 13.1 13.0 - 17.7 g/dL   Hematocrit 38.7 37.5 - 51.0 %   MCV 89 79 - 97 fL   MCH 30.2 26.6 - 33.0 pg   MCHC 33.9 31 - 35 g/dL   RDW 12.8 11.6 - 15.4 %   Platelets 324 150 - 450 x10E3/uL   Neutrophils 67 Not Estab. %   Lymphs 15 Not Estab. %   Monocytes 11 Not Estab. %   Eos 5 Not Estab. %   Basos 1 Not Estab. %   Neutrophils Absolute 5.1 1.40 - 7.00 x10E3/uL   Lymphocytes Absolute 1.2 0 - 3 x10E3/uL   Monocytes Absolute 0.8 0 - 0 x10E3/uL   EOS (ABSOLUTE) 0.4 0.0 - 0.4 x10E3/uL   Basophils Absolute 0.1 0 - 0 x10E3/uL   Immature Granulocytes 1 Not Estab. %   Immature Grans (Abs) 0.1 0.0 - 0.1 x10E3/uL  Pro b natriuretic peptide (BNP)  Result Value Ref Range   NT-Pro BNP 100 0 - 121 pg/mL  Specimen status report  Result Value Ref Range   specimen status report Comment      Assessment/ Plan: Shawn [name] was seen today for one week follow up.  Diagnoses and all orders for this visit:  Acute saddle pulmonary embolism without acute cor pulmonale (HCC) Dyspnea on exertion Continue Xarelto daily. Lung sounds improved from last week. Dyspnea has also improved slightly. Follow up in 3 weeks, sooner if new or worsening symptoms.  -     rivaroxaban (XARELTO) 20 MG TABS tablet; Take 1 tablet (20 mg total) by mouth daily with supper.  Blood in stool, frank No longer seeing blood in stool but continues to report constipation. Increase water intake. Daily stool softener, miralax as needed. Handout given on constipation. Keep GI appointment on 11/10/19.  The above assessment and management plan was discussed with the patient. The patient verbalized understanding of and has agreed  to the management plan. Patient is aware to call the clinic if symptoms persist or worsen. Patient is aware when to return to the clinic for a follow-up visit. Patient educated on when it is appropriate to go to the emergency department.   Marjorie Smolder, FNP-C [facility] [address] [place], [place] [address] [phone]

## 2019-10-24 NOTE — Patient Instructions (Signed)

## 2019-10-25 ENCOUNTER — Encounter: Payer: Self-pay | Admitting: Family Medicine

## 2019-11-08 ENCOUNTER — Encounter: Payer: Self-pay | Admitting: Family Medicine

## 2019-11-10 ENCOUNTER — Encounter: Payer: Self-pay | Admitting: Gastroenterology

## 2019-11-10 ENCOUNTER — Ambulatory Visit (INDEPENDENT_AMBULATORY_CARE_PROVIDER_SITE_OTHER): Payer: BC Managed Care – PPO | Admitting: Gastroenterology

## 2019-11-10 VITALS — BP 134/88 | HR 81 | Ht 71.0 in | Wt 268.0 lb

## 2019-11-10 DIAGNOSIS — Z86010 Personal history of colonic polyps: Secondary | ICD-10-CM | POA: Diagnosis not present

## 2019-11-10 DIAGNOSIS — K5909 Other constipation: Secondary | ICD-10-CM | POA: Diagnosis not present

## 2019-11-10 MED ORDER — LINACLOTIDE 290 MCG PO CAPS: 290.0000 ug | ORAL_CAPSULE | Freq: Every day | ORAL | 4 refills | Status: DC

## 2019-11-10 NOTE — Patient Instructions (Addendum)
If you are age 49 or older, your body mass index should be between 23-30. Your Body mass index is 37.38 kg/m. If this is out of the aforementioned range listed, please consider follow up with your Primary Care Provider.  If you are age 86 or younger, your body mass index should be between 19-25. Your Body mass index is 37.38 kg/m. If this is out of the aformentioned range listed, please consider follow up with your Primary Care Provider.   START Linzess 290 mcg 1 capsule daily. We have given you samples today and sent in a prescription to your pharmacy.  Please call the office in 2-3 weeks and as for Hilma Favors to give an update on how you are doing.  We have put you in for a recall for April to discuss a follow up Colonoscopy.

## 2019-11-10 NOTE — Progress Notes (Signed)
11/10/2019 Shawn [name] [id number] 12-04-1970   HISTORY OF PRESENT ILLNESS: This is a 49 year old male who is new to our office.  He has been referred here by his PCP, Harlow Mares, FNP/[name], FNP, for evaluation regarding rectal bleeding.  He tells me that he has struggled with chronic constipation for a long time.  Had previously been seen by gastroenterologist and was prescribed Linzess, he believes 290 mcg.  He says that that worked well for him, but once the prescription ran out he never returned to them for new prescription.  He says that he has tried fiber and stool softeners, etc. but a lot of times he will go up to 2 weeks without a bowel movement and those things do not really seem to help him.  He was recently hospitalized for a couple of weeks in September with COVID-19 pneumonia and and saddle pulmonary emboli related to that.  He was placed on Xarelto.  He continues on oxygen and is still really struggling with his breathing.  He says that he was taking high-dose zinc and vitamin D, which seemed to worsen his constipation.  He was having really hard stools and straining and had a very small amount of bright red blood per rectum.  He says his PCP examined him and told him that he had mild or small hemorrhoids.  He has not had any further bleeding recently.  He had a colonoscopy in June 2015 at which time it looks like he had 1 sessile serrated adenoma/polyp removed.  I do not actually have the colonoscopy report but the pathology is what is available.   Past Medical History:  Diagnosis Date  . Elevated LFTs   . OSA (obstructive sleep apnea)   . Pulmonary embolism (HCC)    History reviewed. No pertinent surgical history.  reports that he has quit smoking. He has a 3.75 pack-year smoking history. He has never used smokeless tobacco. He reports current alcohol use. He reports that he does not use drugs. family history includes Alzheimer's disease in his paternal  grandmother; Cancer in his paternal grandfather; Diabetes in his maternal grandmother. Allergies  Allergen Reactions  . Ativan [Lorazepam] Other (See Comments)    Agitation, aggressive  . Codeine Other (See Comments)    HALLUCINATIONS      Outpatient Encounter Medications as of 11/10/2019  Medication Sig  . albuterol (VENTOLIN HFA) 108 (90 Base) MCG/ACT inhaler Inhale 1-2 puffs into the lungs every 4 (four) hours as needed for wheezing or shortness of breath.  . clonazePAM (KLONOPIN) 0.5 MG tablet Take 1 tablet (0.5 mg total) by mouth 2 (two) times daily as needed for anxiety.  Marland Kitchen MELATONIN PO Take by mouth.  . rivaroxaban (XARELTO) 20 MG TABS tablet Take 1 tablet (20 mg total) by mouth daily with supper.  . [DISCONTINUED] ascorbic acid (VITAMIN C) 500 MG tablet Take 1 tablet (500 mg total) by mouth daily.  . [DISCONTINUED] VITAMIN D PO Take by mouth.  . [DISCONTINUED] zinc sulfate 220 (50 Zn) MG capsule Take 1 capsule (220 mg total) by mouth daily.   No facility-administered encounter medications on file as of 11/10/2019.     REVIEW OF SYSTEMS  : All other systems reviewed and negative except where noted in the History of Present Illness.   PHYSICAL EXAM: BP 134/88   Pulse 81   Ht 5\' 11"  (1.803 m)   Wt 268 lb (121.6 kg)   BMI 37.38 kg/m  General: Well developed white male  in no acute distress Head: Normocephalic and atraumatic Eyes:  Sclerae anicteric, conjunctiva pink. Ears: Normal auditory acuity Lungs: Clear throughout to auscultation; no W/R/R.  Unable to take deep breaths. Heart: Regular rate and rhythm; no M/R/G. Abdomen: Soft, non-distended.  BS present.  Non-tender. Musculoskeletal: Symmetrical with no gross deformities  Skin: No lesions on visible extremities Extremities: No edema  Neurological: Alert oriented x 4, grossly non-focal Psychological:  Alert and cooperative. Normal mood and affect  ASSESSMENT AND PLAN: *Chronic constipation: This is a longstanding  issue.  Previously did well with Linzess 290 mcg daily.  Once his prescription ran out he never returned to his previous gastroenterologist to continue the medication.  He is asking if he could restart that.  Prescription sent to pharmacy and samples given today.  I would like him to call back in 2 to 3 weeks with an update on the mediation effectiveness. *Previous history of adenomatous colon polyps: Had colonoscopy in June 2015 with one sessile serrated adenoma removed.  He needs repeat colonoscopy, but he was just placed on Xarelto for saddle emboli related to COVID-19 infection in September.  Is also on oxygen, still struggling extremely with breathing issues at this point.  He will be on Xarelto for at least 6 months.  We will put in an office visit recall for April 05, 2020 at which time we can see him back and reassess regarding anticoagulation and oxygen/breathing status to discuss and possibly schedule colonoscopy.   CC:  Daphine Deutscher, Mary-Margaret, *

## 2019-11-12 NOTE — Progress Notes (Signed)
Addendum: Reviewed and agree with assessment and management plan. Robbye Dede M, MD  

## 2019-11-15 ENCOUNTER — Ambulatory Visit (INDEPENDENT_AMBULATORY_CARE_PROVIDER_SITE_OTHER): Payer: BC Managed Care – PPO | Admitting: Family Medicine

## 2019-11-15 ENCOUNTER — Other Ambulatory Visit: Payer: Self-pay

## 2019-11-15 ENCOUNTER — Encounter: Payer: Self-pay | Admitting: Family Medicine

## 2019-11-15 VITALS — BP 126/86 | HR 76 | Temp 97.8°F | Ht 71.0 in | Wt 260.0 lb

## 2019-11-15 DIAGNOSIS — R0609 Other forms of dyspnea: Secondary | ICD-10-CM

## 2019-11-15 DIAGNOSIS — K921 Melena: Secondary | ICD-10-CM

## 2019-11-15 DIAGNOSIS — I2692 Saddle embolus of pulmonary artery without acute cor pulmonale: Secondary | ICD-10-CM

## 2019-11-15 DIAGNOSIS — R0600 Dyspnea, unspecified: Secondary | ICD-10-CM

## 2019-11-15 NOTE — Patient Instructions (Signed)

## 2019-11-15 NOTE — Progress Notes (Signed)
Subjective: CC: dyspnea 3 week follow up PCP: [name], FNP  EUM:PNTIRW Rosenberger is a 49 y.o. male presenting to clinic today for:  1. Dyspnea  Dezman reports some improvement in his symptoms. He reports that he is usually on 2L on oxygen at night now. He is able to walk a little further than before, though he does have to take breaks.He was able to walk into the office today from the parking lot but had to stop a few times. At previous visit, he had to be dropped off at the door. He is mostly on 3-4L of oxygen during the day. He can tolerate being without the oxygen for very short periods of time when sitting on the couch. He does report some pain longer this lower ribs on both sides that occurs sometimes with coughing or sneezing.    2. Blood in stool Broadus John saw GI on 11/10/19. He was started on Linzess for constipation. They will try to schedule a colonoscopy in April if he is off the blood thinner and oxygen. He is no longer seeing blood in his stool now. He reports daily BM without straining now. Denies rectal itching, pain, or burning.   Relevant past medical, surgical, family, and social history reviewed and updated as indicated.  Allergies and medications reviewed and updated.  Allergies  Allergen Reactions  . Ativan [Lorazepam] Other (See Comments)    Agitation, aggressive  . Codeine Other (See Comments)    HALLUCINATIONS   Past Medical History:  Diagnosis Date  . Elevated LFTs   . OSA (obstructive sleep apnea)   . Pulmonary embolism (HCC)     Current Outpatient Medications:  .  albuterol (VENTOLIN HFA) 108 (90 Base) MCG/ACT inhaler, Inhale 1-2 puffs into the lungs every 4 (four) hours as needed for wheezing or shortness of breath., Disp: 8 g, Rfl: 1 .  clonazePAM (KLONOPIN) 0.5 MG tablet, Take 1 tablet (0.5 mg total) by mouth 2 (two) times daily as needed for anxiety., Disp: 20 tablet, Rfl: 1 .  linaclotide (LINZESS) 290 MCG CAPS capsule, Take 1 capsule (290 mcg  total) by mouth daily before breakfast., Disp: 90 capsule, Rfl: 4 .  MELATONIN PO, Take by mouth., Disp: , Rfl:  .  rivaroxaban (XARELTO) 20 MG TABS tablet, Take 1 tablet (20 mg total) by mouth daily with supper., Disp: 90 tablet, Rfl: 1 Social History   Socioeconomic History  . Marital status: Married    Spouse name: Not on file  . Number of children: Not on file  . Years of education: Not on file  . Highest education level: Not on file  Occupational History  . Not on file  Tobacco Use  . Smoking status: Former Smoker    Packs/day: 0.25    Years: 15.00    Pack years: 3.75  . Smokeless tobacco: Never Used  Vaping Use  . Vaping Use: Never used  Substance and Sexual Activity  . Alcohol use: Yes  . Drug use: No  . Sexual activity: Yes    Birth control/protection: None  Other Topics Concern  . Not on file  Social History Narrative  . Not on file   Social Determinants of Health   Financial Resource Strain:   . Difficulty of Paying Living Expenses: Not on file  Food Insecurity:   . Worried About Charity fundraiser in the Last Year: Not on file  . Ran Out of Food in the Last Year: Not on file  Transportation Needs:   .  Lack of Transportation (Medical): Not on file  . Lack of Transportation (Non-Medical): Not on file  Physical Activity:   . Days of Exercise per Week: Not on file  . Minutes of Exercise per Session: Not on file  Stress:   . Feeling of Stress : Not on file  Social Connections:   . Frequency of Communication with Friends and Family: Not on file  . Frequency of Social Gatherings with Friends and Family: Not on file  . Attends Religious Services: Not on file  . Active Member of Clubs or Organizations: Not on file  . Attends Archivist Meetings: Not on file  . Marital Status: Not on file  Intimate Partner Violence:   . Fear of Current or Ex-Partner: Not on file  . Emotionally Abused: Not on file  . Physically Abused: Not on file  . Sexually  Abused: Not on file   Family History  Problem Relation Age of Onset  . Diabetes Maternal Grandmother   . Alzheimer's disease Paternal Grandmother   . Cancer Paternal Grandfather        THROAT    Review of Systems  Constitutional: Negative for chills and fever.  HENT: Negative for trouble swallowing.   Eyes: Negative for visual disturbance.  Respiratory: Positive for cough and shortness of breath.   Cardiovascular: Negative for chest pain, palpitations and leg swelling.  Gastrointestinal: Negative for abdominal distention, abdominal pain, anal bleeding, blood in stool, constipation, nausea, rectal pain and vomiting.  Neurological: Negative for dizziness, facial asymmetry, speech difficulty, weakness and light-headedness.     Objective: Office vital signs reviewed. BP 126/86   Pulse 76   Temp 97.8 F (36.6 C) (Temporal)   Ht _0  (1.803 m)   Wt 260 lb (117.9 kg)   SpO2 98%   BMI 36.26 kg/m   Physical Examination:  Physical Exam Vitals and nursing note reviewed.  Constitutional:      General: He is not in acute distress.    Appearance: He is not ill-appearing, toxic-appearing or diaphoretic.  Cardiovascular:     Rate and Rhythm: Normal rate and regular rhythm.     Heart sounds: Normal heart sounds. No murmur heard.   Pulmonary:     Effort: No tachypnea or respiratory distress.     Breath sounds: Examination of the right-lower field reveals decreased breath sounds. Examination of the left-lower field reveals decreased breath sounds. Decreased breath sounds present. No wheezing, rhonchi or rales.  Abdominal:     General: Bowel sounds are normal. There is no distension.     Palpations: Abdomen is soft.     Tenderness: There is no abdominal tenderness. There is no guarding or rebound.  Musculoskeletal:     Right lower leg: No edema.     Left lower leg: No edema.  Skin:    General: Skin is warm and dry.  Neurological:     General: No focal deficit present.     Mental  Status: He is alert and oriented to person, place, and time.  Psychiatric:        Mood and Affect: Mood normal.        Behavior: Behavior normal.     Results for orders placed or performed in visit on 10/17/19  D-dimer, quantitative (not at [facility])  Result Value Ref Range   D-DIMER 1.01 (H) 0.00 - 0.49 mg/L FEU  CMP14+EGFR  Result Value Ref Range   Glucose 107 (H) 65 - 99 mg/dL   BUN 7 6 -  24 mg/dL   Creatinine, Ser 1.00 0.76 - 1.27 mg/dL   GFR calc non Af Amer 88 >59 mL/min/1.73   GFR calc Af Amer 102 >59 mL/min/1.73   BUN/Creatinine Ratio 7 (L) 9 - 20   Sodium 140 134 - 144 mmol/L   Potassium 4.1 3.5 - 5.2 mmol/L   Chloride 101 96 - 106 mmol/L   CO2 27 20 - 29 mmol/L   Calcium 9.1 8.7 - 10.2 mg/dL   Total Protein 6.3 6.0 - 8.5 g/dL   Albumin 3.7 (L) 4.0 - 5.0 g/dL   Globulin, Total 2.6 1.5 - 4.5 g/dL   Albumin/Globulin Ratio 1.4 1.2 - 2.2   Bilirubin Total 0.3 0.0 - 1.2 mg/dL   Alkaline Phosphatase 102 44 - 121 IU/L   AST 20 0 - 40 IU/L   ALT 30 0 - 44 IU/L  Hemoglobin, fingerstick  Result Value Ref Range   Hemoglobin 12.5 (L) 12.6 - 17.7 g/dL  CBC with Differential/Platelet  Result Value Ref Range   WBC 7.6 3.4 - 10.8 x10E3/uL   RBC 4.34 4.14 - 5.80 x10E6/uL   Hemoglobin 13.1 13.0 - 17.7 g/dL   Hematocrit 38.7 37.5 - 51.0 %   MCV 89 79 - 97 fL   MCH 30.2 26.6 - 33.0 pg   MCHC 33.9 31 - 35 g/dL   RDW 12.8 11.6 - 15.4 %   Platelets 324 150 - 450 x10E3/uL   Neutrophils 67 Not Estab. %   Lymphs 15 Not Estab. %   Monocytes 11 Not Estab. %   Eos 5 Not Estab. %   Basos 1 Not Estab. %   Neutrophils Absolute 5.1 1.40 - 7.00 x10E3/uL   Lymphocytes Absolute 1.2 0 - 3 x10E3/uL   Monocytes Absolute 0.8 0 - 0 x10E3/uL   EOS (ABSOLUTE) 0.4 0.0 - 0.4 x10E3/uL   Basophils Absolute 0.1 0 - 0 x10E3/uL   Immature Granulocytes 1 Not Estab. %   Immature Grans (Abs) 0.1 0.0 - 0.1 x10E3/uL  Pro b natriuretic peptide (BNP)  Result Value Ref Range   NT-Pro BNP 100 0 - 121 pg/mL    Specimen status report  Result Value Ref Range   specimen status report Comment      Assessment/ Plan: Thurman was seen today for 3 week follow up.  Diagnoses and all orders for this visit:  Acute saddle pulmonary embolism without acute cor pulmonale (HCC) Dyspnea on exertion Continue Xarelto. Some improvement in dyspnea since last visit, although not enough to return to work at this time. Continue oxygen as needed. Kennan appears more comfortable and in no distress when speaking today. He seems to be in better spirits as well. Follow up in 4 weeks.   Blood in stool, frank Recent GI appointment. Treated with linzess for constipation. Possible colonoscpoy in April of next year. No blood noted currently.   Follow up in 4 weeks, sooner if needed.   The above assessment and management plan was discussed with the patient. The patient verbalized understanding of and has agreed to the management plan. Patient is aware to call the clinic if symptoms persist or worsen. Patient is aware when to return to the clinic for a follow-up visit. Patient educated on when it is appropriate to go to the emergency department.   Marjorie Smolder, FNP-C [facility] [address] [place], [place] [address] [phone]

## 2019-11-19 DIAGNOSIS — U071 COVID-19: Secondary | ICD-10-CM | POA: Diagnosis not present

## 2019-11-21 DIAGNOSIS — U071 COVID-19: Secondary | ICD-10-CM | POA: Diagnosis not present

## 2019-11-28 ENCOUNTER — Telehealth: Payer: Self-pay | Admitting: Gastroenterology

## 2019-11-28 NOTE — Telephone Encounter (Signed)
The pt is calling to update that the linzess doing very well. He will call if he has any concerns in the future.

## 2019-11-28 NOTE — Telephone Encounter (Signed)
Pt is calling in with an update for the nurse regarding how the Linzess has been working for him, pt states it working very well!

## 2019-11-28 NOTE — Telephone Encounter (Signed)
Great. Thank you for the update.

## 2019-12-10 ENCOUNTER — Other Ambulatory Visit: Payer: Self-pay | Admitting: Family Medicine

## 2019-12-10 DIAGNOSIS — R0609 Other forms of dyspnea: Secondary | ICD-10-CM

## 2019-12-10 DIAGNOSIS — R0600 Dyspnea, unspecified: Secondary | ICD-10-CM

## 2019-12-14 ENCOUNTER — Encounter: Payer: Self-pay | Admitting: Nurse Practitioner

## 2019-12-14 ENCOUNTER — Ambulatory Visit (INDEPENDENT_AMBULATORY_CARE_PROVIDER_SITE_OTHER): Payer: BC Managed Care – PPO | Admitting: Nurse Practitioner

## 2019-12-14 ENCOUNTER — Other Ambulatory Visit: Payer: Self-pay

## 2019-12-14 VITALS — BP 123/84 | HR 72 | Temp 97.4°F | Resp 20 | Ht 71.0 in | Wt 278.0 lb

## 2019-12-14 DIAGNOSIS — Z86711 Personal history of pulmonary embolism: Secondary | ICD-10-CM | POA: Diagnosis not present

## 2019-12-14 DIAGNOSIS — R7989 Other specified abnormal findings of blood chemistry: Secondary | ICD-10-CM

## 2019-12-14 DIAGNOSIS — I2692 Saddle embolus of pulmonary artery without acute cor pulmonale: Secondary | ICD-10-CM

## 2019-12-14 DIAGNOSIS — Z720 Tobacco use: Secondary | ICD-10-CM | POA: Diagnosis not present

## 2019-12-14 DIAGNOSIS — E669 Obesity, unspecified: Secondary | ICD-10-CM

## 2019-12-14 DIAGNOSIS — F419 Anxiety disorder, unspecified: Secondary | ICD-10-CM

## 2019-12-14 DIAGNOSIS — N62 Hypertrophy of breast: Secondary | ICD-10-CM

## 2019-12-14 MED ORDER — CITALOPRAM HYDROBROMIDE 20 MG PO TABS: 20.0000 mg | ORAL_TABLET | Freq: Every day | ORAL | 5 refills | Status: DC

## 2019-12-14 MED ORDER — RIVAROXABAN 20 MG PO TABS: 20.0000 mg | ORAL_TABLET | Freq: Every day | ORAL | 1 refills | Status: DC

## 2019-12-14 MED ORDER — CLONAZEPAM 0.5 MG PO TABS: 0.5000 mg | ORAL_TABLET | Freq: Two times a day (BID) | ORAL | 1 refills | Status: DC | PRN

## 2019-12-14 NOTE — Progress Notes (Addendum)
Subjective:    Patient ID: Shawn [name], male    DOB: [birth year], 49 y.o.   MRN: [id number]   Chief Complaint: Follow-up    HPI:  1. History of pulmonary embolus (PE) Patient went to hospital on 11/14/19 with SOB and had a saddle pulmonary emboli. He is here today for follow up. He actaully had covid, developed pneumonia then had PE. He is still on oxygen at home. Says he is able to go short periods of time without it. He does sleep with it. He says he is better but still does not have satmina he had before. He has occasional chest  Pain when he coughs or sneezes.  O2 sat at rest with O2- 99%. O2 sat without O2 92-93% 2. Tobacco abuse He quit smoking on August 29,2021.  3. Elevated LFTs Lab Results  Component Value Date   ALT 30 10/17/2019   AST 20 10/17/2019   ALKPHOS 102 10/17/2019   BILITOT 0.3 10/17/2019     4. Obesity, Class II, BMI 35-39.9 Weight is up 18 lbs since last visit.  Wt Readings from Last 3 Encounters:  12/14/19 278 lb (126.1 kg)  11/15/19 260 lb (117.9 kg)  11/10/19 268 lb (121.6 kg)   BMI Readings from Last 3 Encounters:  12/14/19 38.77 kg/m  11/15/19 36.26 kg/m  11/10/19 37.38 kg/m    5. anxiety Has had since he had covid and has been taking klonopin as needed when has feels SOB.  Outpatient Encounter Medications as of 12/14/2019  Medication Sig  . albuterol (VENTOLIN HFA) 108 (90 Base) MCG/ACT inhaler INHALE 1-2 PUFFS INTO THE LUNGS EVERY 4 HOURS AS NEEDED FOR WHEEZING OR SHORTNESS OF BREATH.  . clonazePAM (KLONOPIN) 0.5 MG tablet Take 1 tablet (0.5 mg total) by mouth 2 (two) times daily as needed for anxiety.  Marland Kitchen linaclotide (LINZESS) 290 MCG CAPS capsule Take 1 capsule (290 mcg total) by mouth daily before breakfast.  . MELATONIN PO Take by mouth.  . rivaroxaban (XARELTO) 20 MG TABS tablet Take 1 tablet (20 mg total) by mouth daily with supper.     History reviewed. No pertinent surgical history.  Family History  Problem Relation Age of  Onset  . Diabetes Maternal Grandmother   . Alzheimer's disease Paternal Grandmother   . Cancer Paternal Grandfather        THROAT    New complaints: None today  Social history: Lives with girtlfriend. He has not ben able to work since he had covid.  Controlled substance contract: n/a    Review of Systems  Constitutional: Negative for diaphoresis.  Eyes: Negative for pain.  Respiratory: Negative for shortness of breath.   Cardiovascular: Negative for chest pain, palpitations and leg swelling.  Gastrointestinal: Negative for abdominal pain.  Endocrine: Negative for polydipsia.  Skin: Negative for rash.  Neurological: Negative for dizziness, weakness and headaches.  Hematological: Does not bruise/bleed easily.  All other systems reviewed and are negative.      Objective:   Physical Exam Vitals and nursing note reviewed.  Constitutional:      Appearance: Normal appearance. He is well-developed and well-nourished.  HENT:     Head: Normocephalic.     Nose: Nose normal.     Mouth/Throat:     Mouth: Oropharynx is clear and moist.  Eyes:     Extraocular Movements: EOM normal.     Pupils: Pupils are equal, round, and reactive to light.  Neck:     Thyroid: No thyroid mass or thyromegaly.  Vascular: No carotid bruit or JVD.     Trachea: Phonation normal.  Cardiovascular:     Rate and Rhythm: Normal rate and regular rhythm.     Pulses: Normal pulses.     Heart sounds: Normal heart sounds.  Pulmonary:     Effort: Pulmonary effort is normal. No respiratory distress.     Breath sounds: Normal breath sounds.     Comments: O2 via nasal canulla Abdominal:     General: Bowel sounds are normal. Aorta is normal.     Palpations: Abdomen is soft.     Tenderness: There is no abdominal tenderness.  Musculoskeletal:        General: Normal range of motion.     Cervical back: Normal range of motion and neck supple.  Lymphadenopathy:     Cervical: No cervical adenopathy.   Skin:    General: Skin is warm and dry.  Neurological:     Mental Status: He is alert and oriented to person, place, and time.  Psychiatric:        Mood and Affect: Mood and affect normal.        Behavior: Behavior normal.        Thought Content: Thought content normal.        Judgment: Judgment normal.     BP 123/84   Pulse 72   Temp (!) 97.4 F (36.3 C) (Temporal)   Resp 20   Ht 5\' 11"  (1.803 m)   Wt 278 lb (126.1 kg)   SpO2 99% Comment: On 1.5 liters  BMI 38.77 kg/m        Assessment & Plan:  Tyreon Frigon comes in today with chief complaint of Follow-up   Diagnosis and orders addressed:  1. History of pulmonary embolus (PE) To ER if develop SOB Continue eliquis  rivaroxaban (XARELTO) 20 MG TABS tablet; Take 1 tablet (20 mg total) by mouth daily with supper.  Dispense: 90 tablet; Refill: 1   2. Tobacco abuse Do not start smoking again  3. Elevated LFTs Will repeat labs oday  4. Obesity, Class II, BMI 35-39.9 Discussed diet and exercise for person with BMI >25 Will recheck weight in 3-6 months  5. anxiety stress management Added celexa20mg  daily Will wean off clonazepam - clonazePAM (KLONOPIN) 0.5 MG tablet; Take 1 tablet (0.5 mg total) by mouth 2 (two) times daily as needed for anxiety.  Dispense: 20 tablet; Refill: 1 - Labs pending Health Maintenance reviewed Diet and exercise encouraged  Follow up plan: 3 months   Mary-Margaret Weston Anna, FNP

## 2019-12-14 NOTE — Addendum Note (Signed)
Addended by: Bennie Pierini on: 12/14/2019 03:54 PM   Modules accepted: Orders

## 2019-12-14 NOTE — Patient Instructions (Signed)
Home Oxygen Use, Adult When a medical condition keeps you from getting enough oxygen, your health care provider may instruct you to take extra oxygen at home. Your health care provider will let you know:  When to take oxygen.  For how long to take oxygen.  How quickly oxygen should be delivered (flow rate), in liters per minute (LPM or L/M). Home oxygen can be given through:  A mask.  A nasal cannula. This is a device or tube that goes in the nostrils.  A transtracheal catheter. This is a small, flexible tube placed in the trachea.  A tracheostomy. This is a surgically made opening in the trachea. These devices are connected with tubing to an oxygen source, such as:  A tank. Tanks hold oxygen in gas form. They must be replaced when the oxygen is used up.  A liquid oxygen device. This holds oxygen in liquid form. It must be replaced when the oxygen is used up.  An oxygen concentrator machine. This filters oxygen in the room. It uses electricity, so you must have a backup cylinder of oxygen in case the power goes out. Supplies needed: To use oxygen, you will need:  A mask, nasal cannula, transtracheal catheter, or tracheostomy.  An oxygen tank, a liquid oxygen device, or an oxygen concentrator.  The tape that your health care provider recommends (optional). If you use a transtracheal catheter and your prescribed flow rate is 1 LPM or greater, you will also need a humidifier. Risks and complications  Fire. This can happen if the oxygen is exposed to a heat source, flame, or spark.  Injury to skin. This can happen if liquid oxygen touches your skin.  Organ damage. This can happen if you get too little oxygen. How to use oxygen Your health care provider or a representative from your medical device company will show you how to use your oxygen device. Follow her or his instructions. The instructions may look something like this: 1. Wash your hands. 2. If you use an oxygen  concentrator, make sure it is plugged in. 3. Place one end of the tube into the port on the tank, device, or machine. 4. Place the mask over your nose and mouth. Or, place the nasal cannula and secure it with tape if instructed. If you use a tracheostomy or transtracheal catheter, connect it to the oxygen source as directed. 5. Make sure the liter-flow setting on the machine is at the level prescribed by your health care provider. 6. Turn on the machine or adjust the knob on the tank or device to the correct liter-flow setting. 7. When you are done, turn off and unplug the machine, or turn the knob to OFF. How to clean and care for the oxygen supplies Nasal cannula  Clean it with a warm, wet cloth daily or as needed.  Wash it with a liquid soap once a week.  Rinse it thoroughly once or twice a week.  Replace it every 2-4 weeks.  If you have an infection, such as a cold or pneumonia, change the cannula when you get better. Mask  Replace it every 2-4 weeks.  If you have an infection, such as a cold or pneumonia, change the mask when you get better. Humidifier bottle  Wash the bottle between each refill: ? Wash it with soap and warm water. ? Rinse it thoroughly. ? Disinfect it and its top. ? Air-dry it.  Make sure it is dry before you refill it. Oxygen concentrator  Clean   the air filter at least twice a week according to directions from your home medical equipment and service company.  Wipe down the cabinet every day. To do this: ? Unplug the unit. ? Wipe down the cabinet with a damp cloth. ? Dry the cabinet. Other equipment  Change any extra tubing every 1-3 months.  Follow instructions from your health care provider about taking care of any other equipment. Safety tips Fire safety tips   Keep your oxygen and oxygen supplies at least 5 ft away from sources of heat, flames, and sparks at all times.  Do not allow smoking near your oxygen. Put up "no smoking" signs in  your home. Avoid smoking areas when in public.  Do not use materials that can burn (are flammable) while you use oxygen.  When you go to a restaurant with portable oxygen, ask to be seated in the nonsmoking section.  Keep a fire extinguisher close by. Let your fire department know that you have oxygen in your home.  Test your home smoke detectors regularly. Traveling  Secure your oxygen tank in the vehicle so that it does not move around. Follow instructions from your medical device company about how to safely secure your tank.  Make sure you have enough oxygen for the amount of time you will be away from home.  If you are planning air travel, contact the airline to find out if they allow the use of an approved portable oxygen concentrator. You may also need documents from your health care provider and medical device company before you travel. General safety tips  If you use an oxygen cylinder, make sure it is in a stand or secured to an object that will not move (fixed object).  If you use liquid oxygen, make sure its container is kept upright.  If you use an oxygen concentrator: ? Tell your electric company. Make sure you are given priority service in the event that your power goes out. ? Avoid using extension cords, if possible. Follow these instructions at home:  Use oxygen only as told by your health care provider.  Do not use alcohol or other drugs that make you relax (sedating drugs) unless instructed. They can slow down your breathing rate and make it hard to get in enough oxygen.  Know how and when to order a refill of oxygen.  Always keep a spare tank of oxygen. Plan ahead for holidays when you may not be able to get a prescription filled.  Use water-based lubricants on your lips or nostrils. Do not use oil-based products like petroleum jelly.  To prevent skin irritation on your cheeks or behind your ears, tuck some gauze under the tubing. Contact a health care  provider if:  You get headaches often.  You have shortness of breath.  You have a lasting cough.  You have anxiety.  You are sleepy all the time.  You develop an illness that affects your breathing.  You cannot exercise at your regular level.  You are restless.  You have difficult or irregular breathing, and it is getting worse.  You have a fever.  You have persistent redness under your nose. Get help right away if:  You are confused.  You have blue lips or fingernails.  You are struggling to breathe. Summary  Your health care provider or a representative from your medical device company will show you how to use your oxygen device. Follow her or his instructions.  If you use an oxygen concentrator, make   sure it is plugged in.  Make sure the liter-flow setting on the machine is at the level prescribed by your health care provider.  Keep your oxygen and oxygen supplies at least 5 ft away from sources of heat, flames, and sparks at all times. This information is not intended to replace advice given to you by your health care provider. Make sure you discuss any questions you have with your health care provider. Document Revised: 06/10/2017 Document Reviewed: 07/16/2015 Elsevier Patient Education  2020 Elsevier Inc.  

## 2019-12-14 NOTE — Addendum Note (Signed)
Addended by: Bennie Pierini on: 12/14/2019 03:53 PM   Modules accepted: Orders

## 2019-12-15 ENCOUNTER — Other Ambulatory Visit: Payer: Self-pay

## 2019-12-15 DIAGNOSIS — N62 Hypertrophy of breast: Secondary | ICD-10-CM

## 2019-12-15 LAB — CMP14+EGFR
ALT: 39 IU/L (ref 0–44)
AST: 23 IU/L (ref 0–40)
Albumin/Globulin Ratio: 2 (ref 1.2–2.2)
Albumin: 4.5 g/dL (ref 4.0–5.0)
Alkaline Phosphatase: 76 IU/L (ref 44–121)
BUN/Creatinine Ratio: 16 (ref 9–20)
BUN: 15 mg/dL (ref 6–24)
Bilirubin Total: 0.4 mg/dL (ref 0.0–1.2)
CO2: 25 mmol/L (ref 20–29)
Calcium: 8.9 mg/dL (ref 8.7–10.2)
Chloride: 103 mmol/L (ref 96–106)
Creatinine, Ser: 0.95 mg/dL (ref 0.76–1.27)
GFR calc Af Amer: 108 mL/min/{1.73_m2} (ref >59)
GFR calc non Af Amer: 94 mL/min/{1.73_m2} (ref >59)
Globulin, Total: 2.3 g/dL (ref 1.5–4.5)
Glucose: 86 mg/dL (ref 65–99)
Potassium: 4.5 mmol/L (ref 3.5–5.2)
Sodium: 138 mmol/L (ref 134–144)
Total Protein: 6.8 g/dL (ref 6.0–8.5)

## 2019-12-15 LAB — CBC WITH DIFFERENTIAL/PLATELET
Basophils Absolute: 0 10*3/uL (ref 0.0–0.2)
Basos: 1 %
EOS (ABSOLUTE): 0.1 10*3/uL (ref 0.0–0.4)
Eos: 3 %
Hematocrit: 46.4 % (ref 37.5–51.0)
Hemoglobin: 15.3 g/dL (ref 13.0–17.7)
Immature Grans (Abs): 0 10*3/uL (ref 0.0–0.1)
Immature Granulocytes: 0 %
Lymphocytes Absolute: 1.5 10*3/uL (ref 0.7–3.1)
Lymphs: 28 %
MCH: 29 pg (ref 26.6–33.0)
MCHC: 33 g/dL (ref 31.5–35.7)
MCV: 88 fL (ref 79–97)
Monocytes Absolute: 0.5 10*3/uL (ref 0.1–0.9)
Monocytes: 9 %
Neutrophils Absolute: 3.3 10*3/uL (ref 1.4–7.0)
Neutrophils: 59 %
Platelets: 214 10*3/uL (ref 150–450)
RBC: 5.28 x10E6/uL (ref 4.14–5.80)
RDW: 13.1 % (ref 11.6–15.4)
WBC: 5.5 10*3/uL (ref 3.4–10.8)

## 2019-12-18 ENCOUNTER — Other Ambulatory Visit: Payer: Self-pay

## 2019-12-18 DIAGNOSIS — N62 Hypertrophy of breast: Secondary | ICD-10-CM

## 2019-12-19 ENCOUNTER — Other Ambulatory Visit: Payer: Self-pay | Admitting: Nurse Practitioner

## 2019-12-19 DIAGNOSIS — U071 COVID-19: Secondary | ICD-10-CM | POA: Diagnosis not present

## 2019-12-19 MED ORDER — SERTRALINE HCL 50 MG PO TABS: 50.0000 mg | ORAL_TABLET | Freq: Every day | ORAL | 2 refills | Status: DC

## 2019-12-21 DIAGNOSIS — U071 COVID-19: Secondary | ICD-10-CM | POA: Diagnosis not present

## 2020-01-09 ENCOUNTER — Other Ambulatory Visit (HOSPITAL_COMMUNITY): Payer: BC Managed Care – PPO

## 2020-01-09 ENCOUNTER — Other Ambulatory Visit: Payer: Self-pay

## 2020-01-09 ENCOUNTER — Ambulatory Visit (HOSPITAL_COMMUNITY)
Admission: RE | Admit: 2020-01-09 | Discharge: 2020-01-09 | Disposition: A | Discharge status: Home / Self Care | Payer: BC Managed Care – PPO | Source: Ambulatory Visit | Attending: Nurse Practitioner | Admitting: Nurse Practitioner

## 2020-01-09 DIAGNOSIS — N62 Hypertrophy of breast: Secondary | ICD-10-CM

## 2020-01-12 ENCOUNTER — Other Ambulatory Visit: Payer: Self-pay | Admitting: Nurse Practitioner

## 2020-01-18 ENCOUNTER — Encounter: Payer: Self-pay | Admitting: Nurse Practitioner

## 2020-01-18 NOTE — Telephone Encounter (Signed)
Called Patient he is aware letter sent to his mychart.

## 2020-01-18 NOTE — Telephone Encounter (Signed)
Spouse calling to see when will letter be ready. Pt has been waiting since 01/16/2020. Please call back or send message on mychart when done.

## 2020-01-19 DIAGNOSIS — U071 COVID-19: Secondary | ICD-10-CM | POA: Diagnosis not present

## 2020-01-21 DIAGNOSIS — U071 COVID-19: Secondary | ICD-10-CM | POA: Diagnosis not present

## 2020-01-29 ENCOUNTER — Other Ambulatory Visit: Payer: Self-pay

## 2020-01-29 ENCOUNTER — Encounter: Payer: Self-pay | Admitting: Nurse Practitioner

## 2020-01-29 ENCOUNTER — Ambulatory Visit: Payer: BC Managed Care – PPO | Admitting: Nurse Practitioner

## 2020-01-29 VITALS — BP 133/88 | HR 82 | Temp 98.1°F | Resp 20 | Ht 71.0 in | Wt 283.0 lb

## 2020-01-29 DIAGNOSIS — U099 Post covid-19 condition, unspecified: Secondary | ICD-10-CM | POA: Diagnosis not present

## 2020-01-29 NOTE — Progress Notes (Signed)
Subjective:    Patient ID: Shawn [name], male    DOB: May 09, 1970, 50 y.o.   MRN: [id number]   Chief Complaint: Wants to discuss going back to work   HPI Patient had covid last September. He developed a blood clot and pulmonary emboli and was in hospital a few days. He was discharged home on oxygen. It has taken until now for him to be able to function without oxygen. He has not needed his oxygen in several weeks. He had a panic attack in the middle of the night and thought he was going to need it, but didn't. O2 stats have remained above 90. Running usually around 94%. He wants to go back to work and needs work note. He is afraid that he will not be able to do his job.     Review of Systems  Constitutional: Negative for diaphoresis.  Eyes: Negative for pain.  Respiratory: Negative for shortness of breath.   Cardiovascular: Negative for chest pain, palpitations and leg swelling.  Gastrointestinal: Negative for abdominal pain.  Endocrine: Negative for polydipsia.  Skin: Negative for rash.  Neurological: Negative for dizziness, weakness and headaches.  Hematological: Does not bruise/bleed easily.  All other systems reviewed and are negative.      Objective:   Physical Exam Vitals and nursing note reviewed.  Constitutional:      Appearance: Normal appearance. He is well-developed and well-nourished.  HENT:     Head: Normocephalic.     Nose: Nose normal.     Mouth/Throat:     Mouth: Oropharynx is clear and moist.  Eyes:     Extraocular Movements: EOM normal.     Pupils: Pupils are equal, round, and reactive to light.  Neck:     Thyroid: No thyroid mass or thyromegaly.     Vascular: No carotid bruit or JVD.     Trachea: Phonation normal.  Cardiovascular:     Rate and Rhythm: Normal rate and regular rhythm.  Pulmonary:     Effort: Pulmonary effort is normal. No respiratory distress.     Breath sounds: Normal breath sounds.  Abdominal:     General: Bowel sounds are normal.  Aorta is normal.     Palpations: Abdomen is soft.     Tenderness: There is no abdominal tenderness.  Musculoskeletal:        General: Normal range of motion.     Cervical back: Normal range of motion and neck supple.  Lymphadenopathy:     Cervical: No cervical adenopathy.  Skin:    General: Skin is warm and dry.  Neurological:     Mental Status: He is alert and oriented to person, place, and time.  Psychiatric:        Mood and Affect: Mood and affect normal.        Behavior: Behavior normal.        Thought Content: Thought content normal.        Judgment: Judgment normal.    BP 133/88   Pulse 82   Temp 98.1 F (36.7 C) (Temporal)   Resp 20   Ht 5\' 11"  (1.803 m)   Wt 283 lb (128.4 kg)   BMI 39.47 kg/m         Assessment & Plan:  Shawn [name] in today with chief complaint of Wants to discuss going back to work   1. Post-COVID syndrome Will attempt to go back to work without restrictions If cannot tolerate we will cross that bridge when we get  there.    The above assessment and management plan was discussed with the patient. The patient verbalized understanding of and has agreed to the management plan. Patient is aware to call the clinic if symptoms persist or worsen. Patient is aware when to return to the clinic for a follow-up visit. Patient educated on when it is appropriate to go to the emergency department.   [name], FNP

## 2020-02-19 DIAGNOSIS — U071 COVID-19: Secondary | ICD-10-CM | POA: Diagnosis not present

## 2020-02-21 DIAGNOSIS — U071 COVID-19: Secondary | ICD-10-CM | POA: Diagnosis not present

## 2020-03-18 DIAGNOSIS — U071 COVID-19: Secondary | ICD-10-CM | POA: Diagnosis not present

## 2020-03-20 DIAGNOSIS — U071 COVID-19: Secondary | ICD-10-CM | POA: Diagnosis not present

## 2020-04-12 ENCOUNTER — Other Ambulatory Visit: Payer: Self-pay | Admitting: Nurse Practitioner

## 2020-04-12 NOTE — Telephone Encounter (Signed)
Last office visit 12/14/19 Last refill 01/12/20, #90, no refills

## 2020-04-18 DIAGNOSIS — U071 COVID-19: Secondary | ICD-10-CM | POA: Diagnosis not present

## 2020-04-20 DIAGNOSIS — U071 COVID-19: Secondary | ICD-10-CM | POA: Diagnosis not present

## 2020-05-18 DIAGNOSIS — U071 COVID-19: Secondary | ICD-10-CM | POA: Diagnosis not present

## 2020-05-20 DIAGNOSIS — U071 COVID-19: Secondary | ICD-10-CM | POA: Diagnosis not present

## 2020-06-18 DIAGNOSIS — U071 COVID-19: Secondary | ICD-10-CM | POA: Diagnosis not present

## 2020-06-20 DIAGNOSIS — U071 COVID-19: Secondary | ICD-10-CM | POA: Diagnosis not present

## 2020-07-11 ENCOUNTER — Other Ambulatory Visit: Payer: Self-pay | Admitting: Nurse Practitioner

## 2020-07-18 DIAGNOSIS — U071 COVID-19: Secondary | ICD-10-CM | POA: Diagnosis not present

## 2020-07-20 DIAGNOSIS — U071 COVID-19: Secondary | ICD-10-CM | POA: Diagnosis not present

## 2020-10-07 ENCOUNTER — Other Ambulatory Visit: Payer: Self-pay | Admitting: Nurse Practitioner

## 2020-10-21 ENCOUNTER — Other Ambulatory Visit: Payer: Self-pay | Admitting: Nurse Practitioner

## 2020-11-19 ENCOUNTER — Other Ambulatory Visit: Payer: Self-pay | Admitting: Gastroenterology

## 2020-11-19 ENCOUNTER — Other Ambulatory Visit: Payer: Self-pay | Admitting: Nurse Practitioner

## 2020-11-19 DIAGNOSIS — F419 Anxiety disorder, unspecified: Secondary | ICD-10-CM

## 2020-11-19 NOTE — Telephone Encounter (Signed)
MMM NTBS 30 days given 11/06/20 for one med, other is a controlled has to be filled during a visit

## 2020-11-20 MED ORDER — SERTRALINE HCL 50 MG PO TABS: 50.0000 mg | ORAL_TABLET | Freq: Every day | ORAL | 0 refills | Status: DC

## 2020-11-20 NOTE — Addendum Note (Signed)
Addended by: Julious Payer D on: 11/20/2020 10:27 AM   Modules accepted: Orders

## 2020-11-20 NOTE — Telephone Encounter (Signed)
Pt scheduled for 11/11/2020

## 2020-11-21 ENCOUNTER — Ambulatory Visit: Payer: BC Managed Care – PPO | Admitting: Nurse Practitioner

## 2020-11-21 ENCOUNTER — Encounter: Payer: Self-pay | Admitting: Nurse Practitioner

## 2020-11-21 ENCOUNTER — Other Ambulatory Visit: Payer: Self-pay

## 2020-11-21 VITALS — BP 123/88 | HR 80 | Temp 97.3°F | Resp 20 | Ht 71.0 in | Wt 292.0 lb

## 2020-11-21 DIAGNOSIS — E669 Obesity, unspecified: Secondary | ICD-10-CM | POA: Diagnosis not present

## 2020-11-21 DIAGNOSIS — R7989 Other specified abnormal findings of blood chemistry: Secondary | ICD-10-CM

## 2020-11-21 DIAGNOSIS — K5909 Other constipation: Secondary | ICD-10-CM

## 2020-11-21 DIAGNOSIS — Z86711 Personal history of pulmonary embolism: Secondary | ICD-10-CM | POA: Diagnosis not present

## 2020-11-21 DIAGNOSIS — Z86010 Personal history of colonic polyps: Secondary | ICD-10-CM | POA: Diagnosis not present

## 2020-11-21 DIAGNOSIS — Z125 Encounter for screening for malignant neoplasm of prostate: Secondary | ICD-10-CM | POA: Diagnosis not present

## 2020-11-21 DIAGNOSIS — Z720 Tobacco use: Secondary | ICD-10-CM

## 2020-11-21 MED ORDER — SERTRALINE HCL 50 MG PO TABS: 50.0000 mg | ORAL_TABLET | Freq: Every day | ORAL | 0 refills | Status: DC

## 2020-11-21 MED ORDER — LINACLOTIDE 290 MCG PO CAPS: 290.0000 ug | ORAL_CAPSULE | Freq: Every day | ORAL | 4 refills | Status: DC

## 2020-11-21 NOTE — Addendum Note (Signed)
Addended by: Bennie Pierini on: 11/21/2020 08:59 AM   Modules accepted: Orders

## 2020-11-21 NOTE — Patient Instructions (Signed)
Managing the Challenge of Quitting Smoking ?Quitting smoking is a physical and mental challenge. You will face cravings, withdrawal symptoms, and temptation. Before quitting, work with your health care provider to make a plan that can help you manage quitting. Preparation can help you quit and keep you from giving in. ?How to manage lifestyle changes ?Managing stress ?Stress can make you want to smoke, and wanting to smoke may cause stress. It is important to find ways to manage your stress. You might try some of the following: ?Practice relaxation techniques. ?Breathe slowly and deeply, in through your nose and out through your mouth. ?Listen to music. ?Soak in a bath or take a shower. ?Imagine a peaceful place or vacation. ?Get some support. ?Talk with family or friends about your stress. ?Join a support group. ?Talk with a counselor or therapist. ?Get some physical activity. ?Go for a walk, run, or bike ride. ?Play a favorite sport. ?Practice yoga. ? ?Medicines ?Talk with your health care provider about medicines that might help you deal with cravings and make quitting easier for you. ?Relationships ?Social situations can be difficult when you are quitting smoking. To manage this, you can: ?Avoid parties and other social situations where people might be smoking. ?Avoid alcohol. ?Leave right away if you have the urge to smoke. ?Explain to your family and friends that you are quitting smoking. Ask for support and let them know you might be a bit grumpy. ?Plan activities where smoking is not an option. ?General instructions ?Be aware that many people gain weight after they quit smoking. However, not everyone does. To keep from gaining weight, have a plan in place before you quit and stick to the plan after you quit. Your plan should include: ?Having healthy snacks. When you have a craving, it may help to: ?Eat popcorn, carrots, celery, or other cut vegetables. ?Chew sugar-free gum. ?Changing how you eat. ?Eat small  portion sizes at meals. ?Eat 4-6 small meals throughout the day instead of 1-2 large meals a day. ?Be mindful when you eat. Do not watch television or do other things that might distract you as you eat. ?Exercising regularly. ?Make time to exercise each day. If you do not have time for a long workout, do short bouts of exercise for 5-10 minutes several times a day. ?Do some form of strengthening exercise, such as weight lifting. ?Do some exercise that gets your heart beating and causes you to breathe deeply, such as walking fast, running, swimming, or biking. This is very important. ?Drinking plenty of water or other low-calorie or no-calorie drinks. Drink 6-8 glasses of water daily. ? ?How to recognize withdrawal symptoms ?Your body and mind may experience discomfort as you try to get used to not having nicotine in your system. These effects are called withdrawal symptoms. They may include: ?Feeling hungrier than normal. ?Having trouble concentrating. ?Feeling irritable or restless. ?Having trouble sleeping. ?Feeling depressed. ?Craving a cigarette. ?To manage withdrawal symptoms: ?Avoid places, people, and activities that trigger your cravings. ?Remember why you want to quit. ?Get plenty of sleep. ?Avoid coffee and other caffeinated drinks. These may worsen some of your symptoms. ?These symptoms may surprise you. But be assured that they are normal to have when quitting smoking. ?How to manage cravings ?Come up with a plan for how to deal with your cravings. The plan should include the following: ?A definition of the specific situation you want to deal with. ?An alternative action you will take. ?A clear idea for how this action   will help. ?The name of someone who might help you with this. ?Cravings usually last for 5-10 minutes. Consider taking the following actions to help you with your plan to deal with cravings: ?Keep your mouth busy. ?Chew sugar-free gum. ?Suck on hard candies or a straw. ?Brush your  teeth. ?Keep your hands and body busy. ?Change to a different activity right away. ?Squeeze or play with a ball. ?Do an activity or a hobby, such as making bead jewelry, practicing needlepoint, or working with wood. ?Mix up your normal routine. ?Take a short exercise break. Go for a quick walk or run up and down stairs. ?Focus on doing something kind or helpful for someone else. ?Call a friend or family member to talk during a craving. ?Join a support group. ?Contact a quitline. ?Where to find support ?To get help or find a support group: ?Call the National Cancer Institute's Smoking Quitline: 1-800-QUIT NOW (784-8669) ?Visit the website of the Substance Abuse and Mental Health Services Administration: www.samhsa.gov ?Text QUIT to SmokefreeTXT: 478848 ?Where to find more information ?Visit these websites to find more information on quitting smoking: ?National Cancer Institute: www.smokefree.gov ?American Lung Association: www.lung.org ?American Cancer Society: www.cancer.org ?Centers for Disease Control and Prevention: www.cdc.gov ?American Heart Association: www.heart.org ?Contact a health care provider if: ?You want to change your plan for quitting. ?The medicines you are taking are not helping. ?Your eating feels out of control or you cannot sleep. ?Get help right away if: ?You feel depressed or become very anxious. ?Summary ?Quitting smoking is a physical and mental challenge. You will face cravings, withdrawal symptoms, and temptation to smoke again. Preparation can help you as you go through these challenges. ?Try different techniques to manage stress, handle social situations, and prevent weight gain. ?You can deal with cravings by keeping your mouth busy (such as by chewing gum), keeping your hands and body busy, calling family or friends, or contacting a quitline for people who want to quit smoking. ?You can deal with withdrawal symptoms by avoiding places where people smoke, getting plenty of rest, and  avoiding drinks with caffeine. ?This information is not intended to replace advice given to you by your health care provider. Make sure you discuss any questions you have with your health care provider. ?Document Revised: 08/30/2020 Document Reviewed: 10/11/2018 ?Elsevier Patient Education ? 2022 Elsevier Inc. ? ?

## 2020-11-21 NOTE — Progress Notes (Signed)
Subjective:    Patient ID: Diane Mochizuki, male    DOB: 07-Sep-1970, 50 y.o.   MRN: [id number]   Chief Complaint: medical management of chronic issues     HPI:  1. Chronic constipation Comes and goes. He takes linzess when needed.  2. Elevated LFTs Lab Results  Component Value Date   ALT 39 12/14/2019   AST 23 12/14/2019   ALKPHOS 76 12/14/2019   BILITOT 0.4 12/14/2019     3. History of colonic polyps Last colonoscopy was done in 2015. He had a sigmoid polyp.  4. History of pulmonary embolus (PE) Patient stopped his eliquis. His clot was due to covid.  5. Tobacco abuse Smokes over a pack a day. He had ct angiogram in 09/2019. He does not want to repeat right now.  6. Anxiety Is on zoloft and is doing well GAD 7 : Generalized Anxiety Score 11/21/2020  Nervous, Anxious, on Edge 2  Control/stop worrying 2  Worry too much - different things 2  Trouble relaxing 2  Restless 1  Easily annoyed or irritable 2  Afraid - awful might happen 1  Total GAD 7 Score 12  Anxiety Difficulty Somewhat difficult    7. Obesity, Class II, BMI 35-39.9 Weight is up 9 lbs. Wt Readings from Last 3 Encounters:  11/21/20 292 lb (132.5 kg)  01/29/20 283 lb (128.4 kg)  12/14/19 278 lb (126.1 kg)   BMI Readings from Last 3 Encounters:  11/21/20 40.73 kg/m  01/29/20 39.47 kg/m  12/14/19 38.77 kg/m      Outpatient Encounter Medications as of 11/21/2020  Medication Sig   albuterol (VENTOLIN HFA) 108 (90 Base) MCG/ACT inhaler INHALE 1-2 PUFFS INTO THE LUNGS EVERY 4 HOURS AS NEEDED FOR WHEEZING OR SHORTNESS OF BREATH.   clonazePAM (KLONOPIN) 0.5 MG tablet Take 1 tablet (0.5 mg total) by mouth 2 (two) times daily as needed for anxiety.   linaclotide (LINZESS) 290 MCG CAPS capsule Take 1 capsule (290 mcg total) by mouth daily before breakfast.   MELATONIN PO Take by mouth.   rivaroxaban (XARELTO) 20 MG TABS tablet Take 1 tablet (20 mg total) by mouth daily with supper.   sertraline  (ZOLOFT) 50 MG tablet Take 1 tablet (50 mg total) by mouth daily.   No facility-administered encounter medications on file as of 11/21/2020.    No past surgical history on file.  Family History  Problem Relation Age of Onset   Diabetes Maternal Grandmother    Alzheimer's disease Paternal Grandmother    Cancer Paternal Grandfather        THROAT    New complaints: None today  Social history: Lives with his wife  Controlled substance contract: n/a      Review of Systems  Constitutional:  Negative for diaphoresis.  Eyes:  Negative for pain.  Respiratory:  Negative for shortness of breath.   Cardiovascular:  Negative for chest pain, palpitations and leg swelling.  Gastrointestinal:  Negative for abdominal pain.  Endocrine: Negative for polydipsia.  Skin:  Negative for rash.  Neurological:  Negative for dizziness, weakness and headaches.  Hematological:  Does not bruise/bleed easily.  All other systems reviewed and are negative.     Objective:   Physical Exam Vitals and nursing note reviewed.  Constitutional:      Appearance: Normal appearance. He is well-developed.  HENT:     Head: Normocephalic.     Nose: Nose normal.  Eyes:     Pupils: Pupils are equal, round, and reactive to  light.  Neck:     Thyroid: No thyroid mass or thyromegaly.     Vascular: No carotid bruit or JVD.     Trachea: Phonation normal.  Cardiovascular:     Rate and Rhythm: Normal rate and regular rhythm.  Pulmonary:     Effort: Pulmonary effort is normal. No respiratory distress.     Breath sounds: Normal breath sounds.  Abdominal:     General: Bowel sounds are normal.     Palpations: Abdomen is soft.     Tenderness: There is no abdominal tenderness.  Musculoskeletal:        General: Normal range of motion.     Cervical back: Normal range of motion and neck supple.  Lymphadenopathy:     Cervical: No cervical adenopathy.  Skin:    General: Skin is warm and dry.  Neurological:      Mental Status: He is alert and oriented to person, place, and time.  Psychiatric:        Behavior: Behavior normal.        Thought Content: Thought content normal.        Judgment: Judgment normal.    BP 123/88   Pulse 80   Temp (!) 97.3 F (36.3 C) (Temporal)   Resp 20   Ht 5\' 11"  (1.803 m)   Wt 292 lb (132.5 kg)   SpO2 96%   BMI 40.73 kg/m        Assessment & Plan:  Dontarius Sheley comes in today with chief complaint of Medical Management of Chronic Issues   Diagnosis and orders addressed:  1. Chronic constipation Continue current routine  2. Elevated LFTs Labs pending  3. History of colonic polyps Patient will call GI to reschedule  4. History of pulmonary embolus (PE) Report any sudden SOB  5. Tobacco abuse Smoking cessation encouraged  6. Obesity, Class II, BMI 35-39.9 Discussed diet and exercise for person with BMI >25 Will recheck weight in 3-6 months     Labs pending Health Maintenance reviewed Diet and exercise encouraged  Follow up plan: 6 months   Mary-Margaret Weston Anna, FNP

## 2020-11-22 LAB — CMP14+EGFR
ALT: 20 IU/L (ref 0–44)
AST: 18 IU/L (ref 0–40)
Albumin/Globulin Ratio: 2 (ref 1.2–2.2)
Albumin: 4.4 g/dL (ref 4.0–5.0)
Alkaline Phosphatase: 99 IU/L (ref 44–121)
BUN/Creatinine Ratio: 15 (ref 9–20)
BUN: 17 mg/dL (ref 6–24)
Bilirubin Total: 0.2 mg/dL (ref 0.0–1.2)
CO2: 24 mmol/L (ref 20–29)
Calcium: 9.2 mg/dL (ref 8.7–10.2)
Chloride: 103 mmol/L (ref 96–106)
Creatinine, Ser: 1.12 mg/dL (ref 0.76–1.27)
Globulin, Total: 2.2 g/dL (ref 1.5–4.5)
Glucose: 102 mg/dL — ABNORMAL HIGH (ref 70–99)
Potassium: 4.7 mmol/L (ref 3.5–5.2)
Sodium: 141 mmol/L (ref 134–144)
Total Protein: 6.6 g/dL (ref 6.0–8.5)
eGFR: 80 mL/min/{1.73_m2} (ref >59)

## 2020-11-22 LAB — LIPID PANEL
Chol/HDL Ratio: 4.6 ratio (ref 0.0–5.0)
Cholesterol, Total: 202 mg/dL — ABNORMAL HIGH (ref 100–199)
HDL: 44 mg/dL (ref >39)
LDL Chol Calc (NIH): 139 mg/dL — ABNORMAL HIGH (ref 0–99)
Triglycerides: 105 mg/dL (ref 0–149)
VLDL Cholesterol Cal: 19 mg/dL (ref 5–40)

## 2020-11-22 LAB — CBC WITH DIFFERENTIAL/PLATELET
Basophils Absolute: 0 10*3/uL (ref 0.0–0.2)
Basos: 1 %
EOS (ABSOLUTE): 0.2 10*3/uL (ref 0.0–0.4)
Eos: 4 %
Hematocrit: 47.3 % (ref 37.5–51.0)
Hemoglobin: 15.6 g/dL (ref 13.0–17.7)
Immature Grans (Abs): 0 10*3/uL (ref 0.0–0.1)
Immature Granulocytes: 0 %
Lymphocytes Absolute: 1.4 10*3/uL (ref 0.7–3.1)
Lymphs: 27 %
MCH: 29.7 pg (ref 26.6–33.0)
MCHC: 33 g/dL (ref 31.5–35.7)
MCV: 90 fL (ref 79–97)
Monocytes Absolute: 0.5 10*3/uL (ref 0.1–0.9)
Monocytes: 9 %
Neutrophils Absolute: 3.1 10*3/uL (ref 1.4–7.0)
Neutrophils: 59 %
Platelets: 184 10*3/uL (ref 150–450)
RBC: 5.25 x10E6/uL (ref 4.14–5.80)
RDW: 13 % (ref 11.6–15.4)
WBC: 5.2 10*3/uL (ref 3.4–10.8)

## 2020-11-22 LAB — PSA, TOTAL AND FREE
PSA, Free Pct: 37.5 %
PSA, Free: 0.3 ng/mL
Prostate Specific Ag, Serum: 0.8 ng/mL (ref 0.0–4.0)

## 2020-12-04 ENCOUNTER — Other Ambulatory Visit: Payer: Self-pay | Admitting: Nurse Practitioner

## 2020-12-04 DIAGNOSIS — F419 Anxiety disorder, unspecified: Secondary | ICD-10-CM

## 2020-12-15 ENCOUNTER — Other Ambulatory Visit: Payer: Self-pay | Admitting: Nurse Practitioner

## 2020-12-15 DIAGNOSIS — F419 Anxiety disorder, unspecified: Secondary | ICD-10-CM

## 2020-12-26 ENCOUNTER — Other Ambulatory Visit: Payer: Self-pay | Admitting: Nurse Practitioner

## 2021-06-28 ENCOUNTER — Other Ambulatory Visit: Payer: Self-pay | Admitting: Nurse Practitioner

## 2021-07-26 ENCOUNTER — Other Ambulatory Visit: Payer: Self-pay | Admitting: Nurse Practitioner

## 2021-07-30 ENCOUNTER — Other Ambulatory Visit: Payer: Self-pay | Admitting: Nurse Practitioner

## 2021-07-30 MED ORDER — SERTRALINE HCL 50 MG PO TABS: 50.0000 mg | ORAL_TABLET | Freq: Every day | ORAL | 0 refills | Status: DC

## 2021-07-30 NOTE — Addendum Note (Signed)
Addended by: Julious Payer D on: 07/30/2021 03:37 PM   Modules accepted: Orders

## 2021-07-30 NOTE — Telephone Encounter (Signed)
MMM NTBS 30 days given 06/28/21

## 2021-07-30 NOTE — Telephone Encounter (Signed)
Apt scheduled 08/07/2021

## 2021-08-07 ENCOUNTER — Ambulatory Visit: Payer: Managed Care, Other (non HMO) | Admitting: Nurse Practitioner

## 2021-08-07 ENCOUNTER — Encounter: Payer: Self-pay | Admitting: Nurse Practitioner

## 2021-08-07 VITALS — BP 123/84 | HR 76 | Temp 97.6°F | Resp 20 | Ht 71.0 in | Wt 285.0 lb

## 2021-08-07 DIAGNOSIS — K5909 Other constipation: Secondary | ICD-10-CM | POA: Diagnosis not present

## 2021-08-07 DIAGNOSIS — R7989 Other specified abnormal findings of blood chemistry: Secondary | ICD-10-CM

## 2021-08-07 DIAGNOSIS — Z720 Tobacco use: Secondary | ICD-10-CM | POA: Diagnosis not present

## 2021-08-07 DIAGNOSIS — E669 Obesity, unspecified: Secondary | ICD-10-CM

## 2021-08-07 DIAGNOSIS — F320 Major depressive disorder, single episode, mild: Secondary | ICD-10-CM | POA: Insufficient documentation

## 2021-08-07 DIAGNOSIS — N6321 Unspecified lump in the left breast, upper outer quadrant: Secondary | ICD-10-CM

## 2021-08-07 MED ORDER — SERTRALINE HCL 100 MG PO TABS: 100.0000 mg | ORAL_TABLET | Freq: Every day | ORAL | 1 refills | Status: DC

## 2021-08-07 MED ORDER — LINACLOTIDE 290 MCG PO CAPS: 290.0000 ug | ORAL_CAPSULE | Freq: Every day | ORAL | 4 refills | Status: DC

## 2021-08-07 NOTE — Patient Instructions (Signed)

## 2021-08-07 NOTE — Progress Notes (Signed)
Subjective:    Patient ID: Yago Ludvigsen, male    DOB: [birth year], 51 y.o.   MRN: [id number]   Chief Complaint: medical management of chronic issues     HPI:  Posey Petrik is a 51 y.o. who identifies as a male who was assigned male at birth.   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 Chronic constipation Is on linzess as needed and is working well.  2. Elevated LFTs Lab Results  Component Value Date   ALT 20 11/21/2020   AST 18 11/21/2020   ALKPHOS 99 11/21/2020   BILITOT 0.2 11/21/2020     3. Depression, major, single episode, mild (HCC) Is on zoloft daily and is not doing well. He says he is very depressed.    08/07/2021    8:20 AM 11/21/2020    8:30 AM 01/29/2020   12:23 PM  Depression screen PHQ 2/9  Decreased Interest 3 2 0  Down, Depressed, Hopeless 3 2 0  PHQ - 2 Score 6 4 0  Altered sleeping 3 3   Tired, decreased energy 3 3   Change in appetite 3 3   Feeling bad or failure about yourself  3 2   Trouble concentrating 3 3   Moving slowly or fidgety/restless 3 2   Suicidal thoughts 1 2   PHQ-9 Score 25 22   Difficult doing work/chores Somewhat difficult Somewhat difficult      4. Tobacco abuse Smokes but only 1-2 cigarettes a day. Helps keep him calm at times.  5. Obesity, Class II, BMI 35-39.9 No recent weight changes Wt Readings from Last 3 Encounters:  08/07/21 285 lb (129.3 kg)  11/21/20 292 lb (132.5 kg)  01/29/20 283 lb (128.4 kg)   BMI Readings from Last 3 Encounters:  08/07/21 39.75 kg/m  11/21/20 40.73 kg/m  01/29/20 39.47 kg/m     New complaints: Left breast mass- tender to touch  Allergies  Allergen Reactions   Ativan [Lorazepam] Other (See Comments)    Agitation, aggressive   Codeine Other (See Comments)    HALLUCINATIONS   Outpatient Encounter Medications as of  08/07/2021  Medication Sig   albuterol (VENTOLIN HFA) 108 (90 Base) MCG/ACT inhaler INHALE 1-2 PUFFS INTO THE LUNGS EVERY 4 HOURS AS NEEDED FOR WHEEZING OR SHORTNESS OF BREATH.   linaclotide (LINZESS) 290 MCG CAPS capsule Take 1 capsule (290 mcg total) by mouth daily before breakfast.   MELATONIN PO Take by mouth.   sertraline (ZOLOFT) 50 MG tablet TAKE 1 TABLET BY MOUTH EVERY DAY   No facility-administered encounter medications on file as of 08/07/2021.    No past surgical history on file.  Family History  Problem Relation Age of Onset   Diabetes Maternal Grandmother    Alzheimer's disease Paternal Grandmother    Cancer Paternal Grandfather        THROAT      Controlled substance contract: n/a     Review of Systems  Constitutional:  Negative for diaphoresis.  Eyes:  Negative for pain.  Respiratory:  Negative for shortness of breath.   Cardiovascular:  Negative  for chest pain, palpitations and leg swelling.  Gastrointestinal:  Negative for abdominal pain.  Endocrine: Negative for polydipsia.  Skin:  Negative for rash.  Neurological:  Negative for dizziness, weakness and headaches.  Hematological:  Does not bruise/bleed easily.  All other systems reviewed and are negative.      Objective:   Physical Exam Vitals and nursing note reviewed.  Constitutional:      Appearance: Normal appearance. He is well-developed.  HENT:     Head: Normocephalic.     Nose: Nose normal.     Mouth/Throat:     Mouth: Mucous membranes are moist.     Pharynx: Oropharynx is clear.  Eyes:     Pupils: Pupils are equal, round, and reactive to light.  Neck:     Thyroid: No thyroid mass or thyromegaly.     Vascular: No carotid bruit or JVD.     Trachea: Phonation normal.  Cardiovascular:     Rate and Rhythm: Normal rate and regular rhythm.  Pulmonary:     Effort: Pulmonary effort is normal. No respiratory distress.     Breath sounds: Normal breath sounds.  Chest:  Breasts:    Left: Mass  and tenderness present.    Abdominal:     General: Bowel sounds are normal.     Palpations: Abdomen is soft.     Tenderness: There is no abdominal tenderness.  Musculoskeletal:        General: Normal range of motion.     Cervical back: Normal range of motion and neck supple.  Lymphadenopathy:     Cervical: No cervical adenopathy.  Skin:    General: Skin is warm and dry.  Neurological:     Mental Status: He is alert and oriented to person, place, and time.  Psychiatric:        Behavior: Behavior normal.        Thought Content: Thought content normal.        Judgment: Judgment normal.     BP 123/84   Pulse 76   Temp 97.6 F (36.4 C) (Temporal)   Resp 20   Ht _0  (1.803 m)   Wt 285 lb (129.3 kg)   SpO2 94%   BMI 39.75 kg/m        Assessment & Plan:  Manas Hickling comes in today with chief complaint of Medical Management of Chronic Issues (Discuss depression and anxiety)   Diagnosis and orders addressed:  1. Chronic constipation Increase fiber in diet - linaclotide (LINZESS) 290 MCG CAPS capsule; Take 1 capsule (290 mcg total) by mouth daily before breakfast.  Dispense: 90 capsule; Refill: 4  2. Elevated LFTs - CBC with Differential/Platelet - CMP14+EGFR - Lipid panel  3. Depression, major, single episode, mild (HCC) Stress management Increase zoloft to 151m a day - sertraline (ZOLOFT) 100 MG tablet; Take 1 tablet (100 mg total) by mouth daily.  Dispense: 90 tablet; Refill: 1  4. Tobacco abuse Smoking cessation   5. Obesity, Class II, BMI 35-39.9 Discussed diet and exercise for person with BMI >25 Will recheck weight in 3-6 months   6. Mass of upper outer quadrant of left breast - UK[place]BREAST COMPLETE UNI LEFT INC AXILLA   Labs pending Health Maintenance reviewed Diet and exercise encouraged  Follow up plan: 6 months   Mary-Margaret MHassell Done FNP

## 2021-08-08 LAB — CBC WITH DIFFERENTIAL/PLATELET
Basophils Absolute: 0 10*3/uL (ref 0.0–0.2)
Basos: 1 %
EOS (ABSOLUTE): 0.2 10*3/uL (ref 0.0–0.4)
Eos: 3 %
Hematocrit: 45.8 % (ref 37.5–51.0)
Hemoglobin: 15.6 g/dL (ref 13.0–17.7)
Immature Grans (Abs): 0 10*3/uL (ref 0.0–0.1)
Immature Granulocytes: 0 %
Lymphocytes Absolute: 1.3 10*3/uL (ref 0.7–3.1)
Lymphs: 21 %
MCH: 29.8 pg (ref 26.6–33.0)
MCHC: 34.1 g/dL (ref 31.5–35.7)
MCV: 88 fL (ref 79–97)
Monocytes Absolute: 0.5 10*3/uL (ref 0.1–0.9)
Monocytes: 8 %
Neutrophils Absolute: 4 10*3/uL (ref 1.4–7.0)
Neutrophils: 67 %
Platelets: 197 10*3/uL (ref 150–450)
RBC: 5.23 x10E6/uL (ref 4.14–5.80)
RDW: 13.5 % (ref 11.6–15.4)
WBC: 6 10*3/uL (ref 3.4–10.8)

## 2021-08-08 LAB — CMP14+EGFR
ALT: 26 IU/L (ref 0–44)
AST: 21 IU/L (ref 0–40)
Albumin/Globulin Ratio: 2.2 (ref 1.2–2.2)
Albumin: 4.4 g/dL (ref 3.8–4.9)
Alkaline Phosphatase: 96 IU/L (ref 44–121)
BUN/Creatinine Ratio: 15 (ref 9–20)
BUN: 17 mg/dL (ref 6–24)
Bilirubin Total: 0.3 mg/dL (ref 0.0–1.2)
CO2: 20 mmol/L (ref 20–29)
Calcium: 9 mg/dL (ref 8.7–10.2)
Chloride: 103 mmol/L (ref 96–106)
Creatinine, Ser: 1.11 mg/dL (ref 0.76–1.27)
Globulin, Total: 2 g/dL (ref 1.5–4.5)
Glucose: 109 mg/dL — ABNORMAL HIGH (ref 70–99)
Potassium: 4.3 mmol/L (ref 3.5–5.2)
Sodium: 140 mmol/L (ref 134–144)
Total Protein: 6.4 g/dL (ref 6.0–8.5)
eGFR: 80 mL/min/{1.73_m2} (ref >59)

## 2021-08-08 LAB — LIPID PANEL
Chol/HDL Ratio: 5 ratio (ref 0.0–5.0)
Cholesterol, Total: 206 mg/dL — ABNORMAL HIGH (ref 100–199)
HDL: 41 mg/dL (ref >39)
LDL Chol Calc (NIH): 129 mg/dL — ABNORMAL HIGH (ref 0–99)
Triglycerides: 201 mg/dL — ABNORMAL HIGH (ref 0–149)
VLDL Cholesterol Cal: 36 mg/dL (ref 5–40)

## 2021-08-11 ENCOUNTER — Other Ambulatory Visit: Payer: Self-pay

## 2021-08-11 ENCOUNTER — Other Ambulatory Visit (HOSPITAL_COMMUNITY): Payer: Self-pay | Admitting: Nurse Practitioner

## 2021-08-11 DIAGNOSIS — N62 Hypertrophy of breast: Secondary | ICD-10-CM

## 2021-08-11 DIAGNOSIS — N6320 Unspecified lump in the left breast, unspecified quadrant: Secondary | ICD-10-CM

## 2021-08-20 ENCOUNTER — Ambulatory Visit: Payer: Managed Care, Other (non HMO) | Admitting: Family Medicine

## 2021-08-20 ENCOUNTER — Encounter: Payer: Self-pay | Admitting: Family Medicine

## 2021-08-20 VITALS — BP 119/82 | HR 87 | Temp 97.1°F | Ht 71.0 in | Wt 283.0 lb

## 2021-08-20 DIAGNOSIS — J301 Allergic rhinitis due to pollen: Secondary | ICD-10-CM

## 2021-08-20 DIAGNOSIS — J0100 Acute maxillary sinusitis, unspecified: Secondary | ICD-10-CM | POA: Diagnosis not present

## 2021-08-20 MED ORDER — LEVOFLOXACIN 500 MG PO TABS: 500.0000 mg | ORAL_TABLET | Freq: Every day | ORAL | 0 refills | Status: DC

## 2021-08-20 MED ORDER — BETAMETHASONE SOD PHOS & ACET 6 (3-3) MG/ML IJ SUSP
6.0000 mg | Freq: Once | INTRAMUSCULAR | Status: AC
  Administered 2021-08-20: 6 mg via INTRAMUSCULAR

## 2021-08-20 NOTE — Progress Notes (Signed)
Chief Complaint  Patient presents with   Sinusitis    HPI  Patient presents today for Patient presents with upper respiratory congestion. Sneezing a lot. No Rhinorrhea. Nasal passages are dry. There is moderate sore throat. Patient reports coughing frequently as well.  brown sputum noted. (Smoker) There is no fever, chills or sweats. The patient denies being short of breath. Onset was the night before last. Gradually worsening.  PMH: Smoking status noted ROS: Per HPI  Objective: BP 119/82   Pulse 87   Temp (!) 97.1 F (36.2 C)   Ht 5\' 11"  (1.803 m)   Wt 283 lb (128.4 kg)   SpO2 95%   BMI 39.47 kg/m  Gen: NAD, alert, cooperative with exam HEENT: NCAT, Nasal passages swollen, red  CV: RRR, good S1/S2, no murmur Resp: Bronchitis changes with scattered wheezes, non-labored Ext: No edema, warm Neuro: Alert and oriented, No gross deficits  Assessment and plan:  1. Acute maxillary sinusitis, recurrence not specified   2. Seasonal allergic rhinitis due to pollen     Meds ordered this encounter  Medications   levofloxacin (LEVAQUIN) 500 MG tablet    Sig: Take 1 tablet (500 mg total) by mouth daily.    Dispense:  7 tablet    Refill:  0   betamethasone acetate-betamethasone sodium phosphate (CELESTONE) injection 6 mg    No orders of the defined types were placed in this encounter.   Follow up as needed.  , MD

## 2021-09-02 ENCOUNTER — Other Ambulatory Visit: Payer: Self-pay | Admitting: Nurse Practitioner

## 2021-09-03 ENCOUNTER — Ambulatory Visit (HOSPITAL_COMMUNITY)
Admission: RE | Admit: 2021-09-03 | Discharge: 2021-09-03 | Discharge status: Home / Self Care | Payer: Managed Care, Other (non HMO) | Source: Ambulatory Visit | Attending: Nurse Practitioner | Admitting: Nurse Practitioner

## 2021-09-03 ENCOUNTER — Ambulatory Visit (HOSPITAL_COMMUNITY)
Admission: RE | Admit: 2021-09-03 | Discharge: 2021-09-03 | Disposition: A | Discharge status: Home / Self Care | Payer: Managed Care, Other (non HMO) | Source: Ambulatory Visit | Attending: Nurse Practitioner | Admitting: Nurse Practitioner

## 2021-09-03 ENCOUNTER — Encounter (HOSPITAL_COMMUNITY): Payer: Self-pay

## 2021-09-03 ENCOUNTER — Telehealth: Payer: Self-pay | Admitting: Nurse Practitioner

## 2021-09-03 DIAGNOSIS — N62 Hypertrophy of breast: Secondary | ICD-10-CM

## 2021-09-03 DIAGNOSIS — N6320 Unspecified lump in the left breast, unspecified quadrant: Secondary | ICD-10-CM | POA: Insufficient documentation

## 2021-09-03 MED ORDER — CLONAZEPAM 0.5 MG PO TABS: 0.5000 mg | ORAL_TABLET | Freq: Two times a day (BID) | ORAL | 0 refills | Status: DC | PRN

## 2021-09-03 NOTE — Telephone Encounter (Signed)
  Prescription Request  09/03/2021  Is this a "Controlled Substance" medicine? Yes   Have you seen your PCP in the last 2 weeks? Yes   If YES, route message to pool  -  If NO, patient needs to be scheduled for appointment.  What is the name of the medication or equipment? klonopin  Have you contacted your pharmacy to request a refill? Yes    Which pharmacy would you like this sent to? [id number] - [place], [place] - [address]    Patient notified that their request is being sent to the clinical staff for review and that they should receive a response within 2 business days.

## 2021-09-03 NOTE — Telephone Encounter (Signed)
TC to [facility] Kelly discussing RF on Clonazepam, this is a controlled medication that has not been on his med list since Nov 2022. She states that he has been taking it, he takes it very sparingly. In looking at history it was [facility] 11/11/20 and has not had any RFs since. She is not understanding why, says this was discussed at the visit on 08/07/21 and was the reason for the visit. Notes state that he is only on Sertraline. Informed her that the Clonazepam is a controlled medication and would still require a visit to be filled, it could not be filled outside of an office visit. Offered visit next week on Thursday & Friday, she will have him call back to make the appointment. Will forward message to PCP for her to know about this for when she returns to office.

## 2021-09-03 NOTE — Telephone Encounter (Signed)
Please let patient know that I sent in a few clonazepam in for him t o last until I get back in town

## 2021-09-03 NOTE — Addendum Note (Signed)
Addended by: Bennie Pierini on: 09/03/2021 03:40 PM   Modules accepted: Orders

## 2021-09-03 NOTE — Telephone Encounter (Signed)
PATIENT AWARE

## 2021-09-05 IMAGING — CT CT ANGIO CHEST
2 of 6 series · 15 of 46 positions shown · IV contrast (Omnipaque or Isovue)
Comparison: Chest x-ray 09/15/2019, CT chest 06/28/2014
COMPARISON: Chest x-ray 09/15/2019, CT chest 06/28/2014

Addendum:
CLINICAL DATA: Shortness of breath and cough, diagnosed with
1TS49-IT on 09/06/2019. History of former smoker, elevated D dimer,
checks x-ray today showing diffuse bilateral patchy and slightly
nodular pulmonary opacities consistent with bilateral pneumonia.

EXAM:
CT ANGIOGRAPHY CHEST WITH CONTRAST
TECHNIQUE: Multidetector CT imaging of the chest was performed using the
standard protocol during bolus administration of intravenous
contrast. Multiplanar CT image reconstructions and MIPs were
obtained to evaluate the vascular anatomy.
CONTRAST:  100mL OMNIPAQUE IOHEXOL 350 MG/ML SOLN

[Series 5: pe axial thins · axial · 0.74mm/px · z∈[+1261,+1534]mm · 12 of 301 slices shown]
[im 14/301  lung]
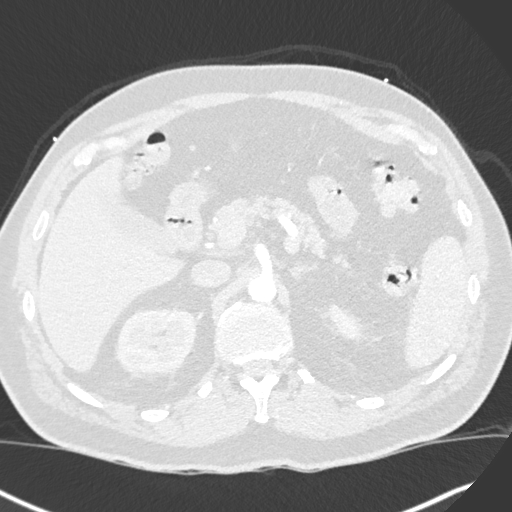
[im 40/301  soft-tissue]
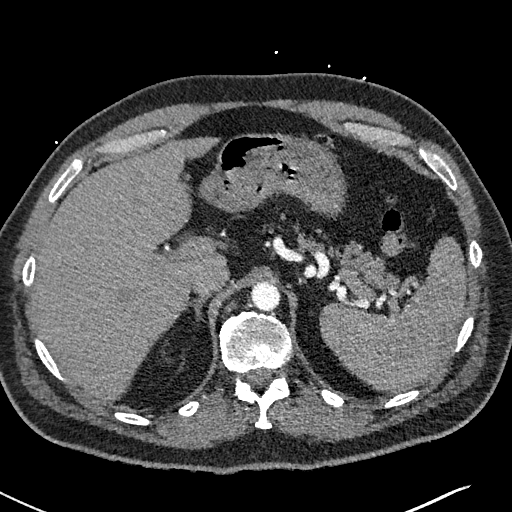
[im 66/301  lung]
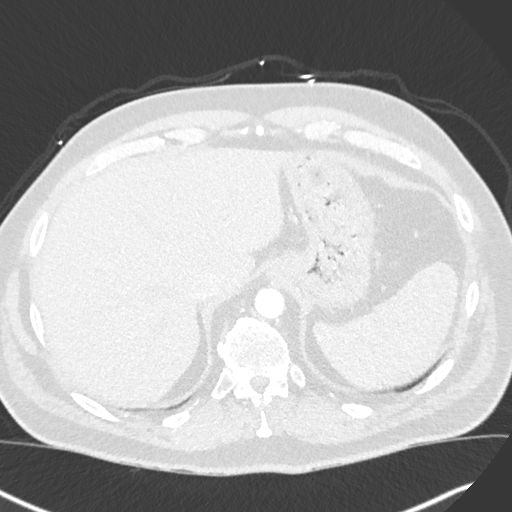
[im 92/301  soft-tissue]
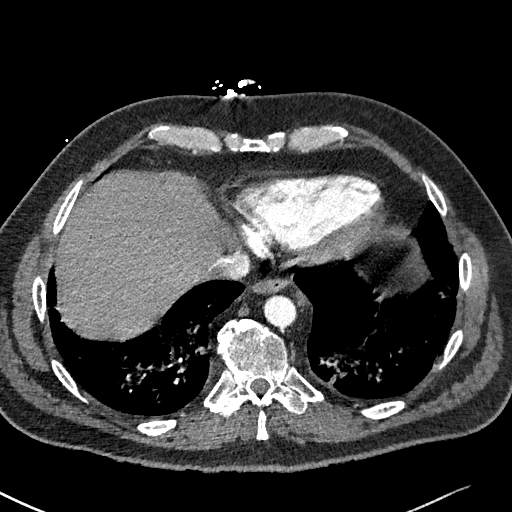
[im 118/301  lung]
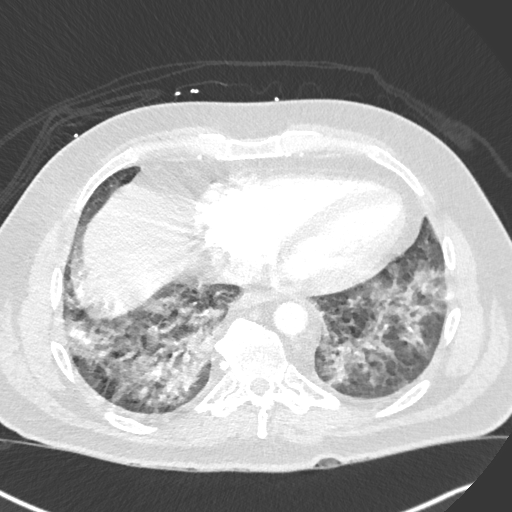
[im 144/301  soft-tissue]
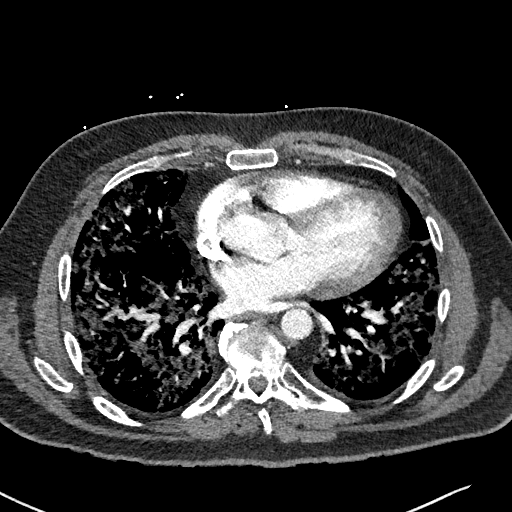
[im 157/301  lung]
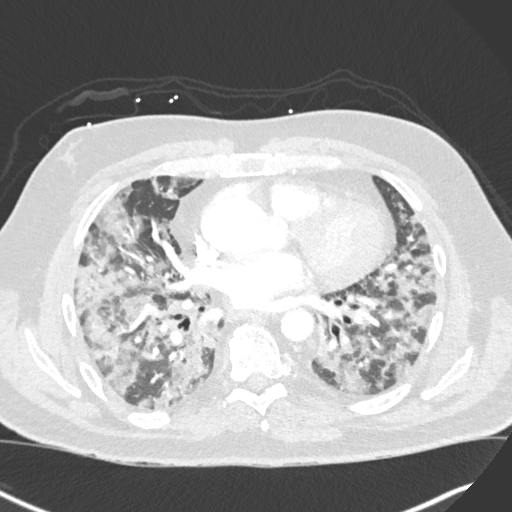
[im 183/301  soft-tissue]
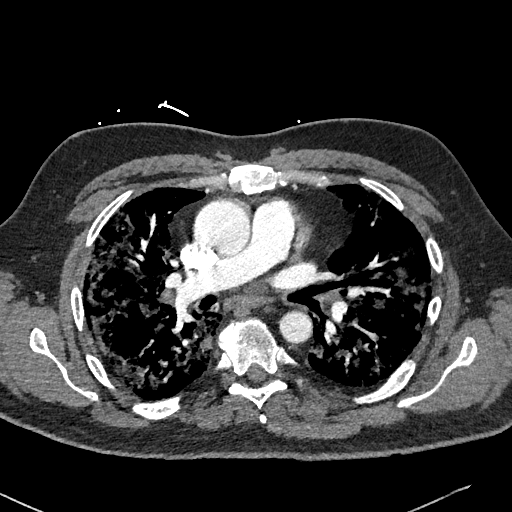
[im 209/301  lung]
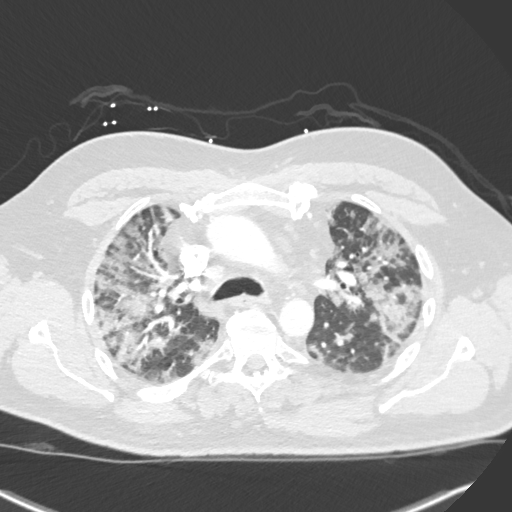
[im 235/301  soft-tissue]
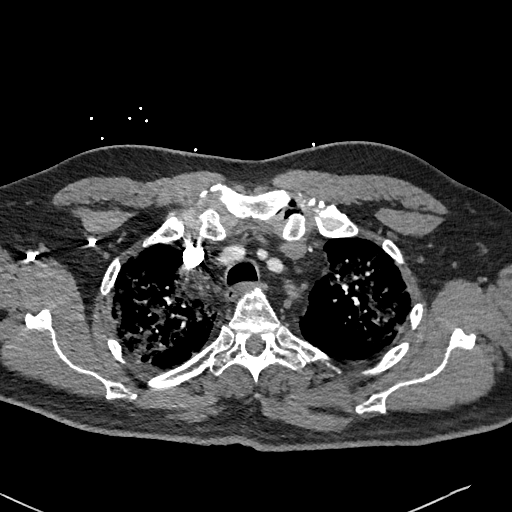
[im 261/301  lung]
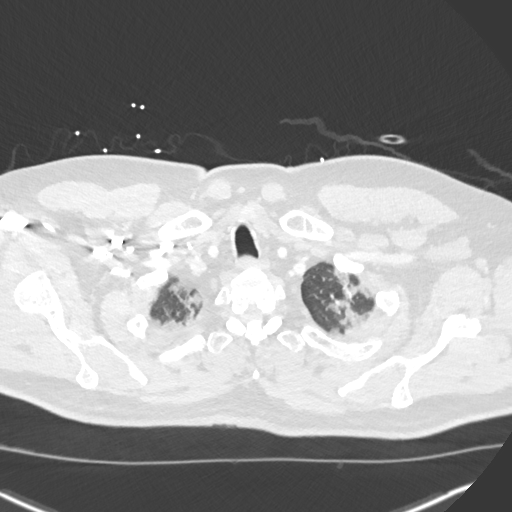
[im 287/301  soft-tissue]
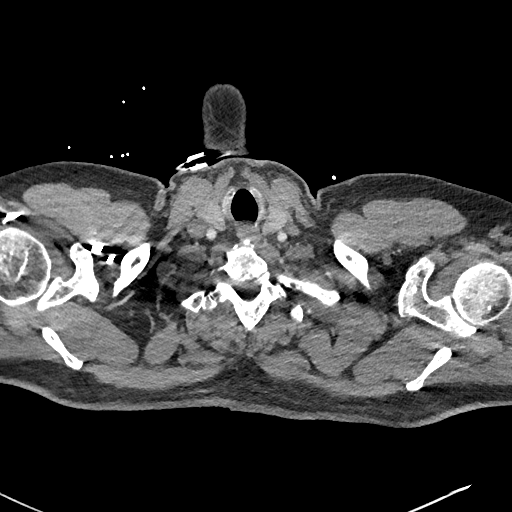

[Series 7: cor soft · coronal · 0.60mm/px · 3 of 135 slices shown]
[im 34/135  soft-tissue]
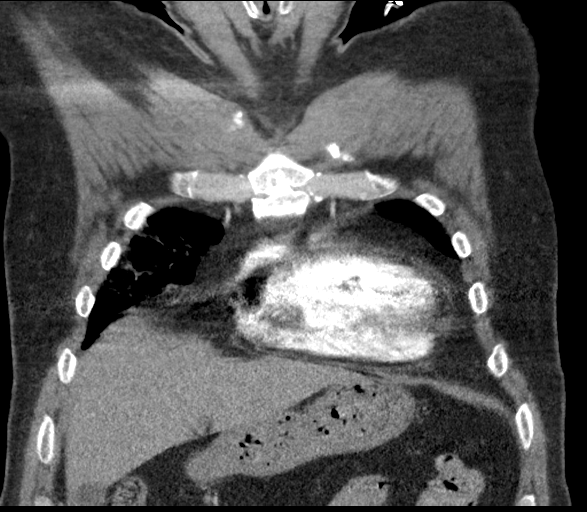
[im 68/135  soft-tissue]
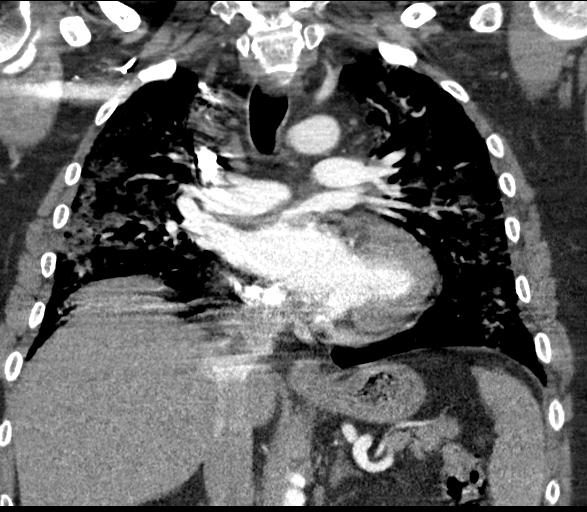
[im 101/135  soft-tissue]
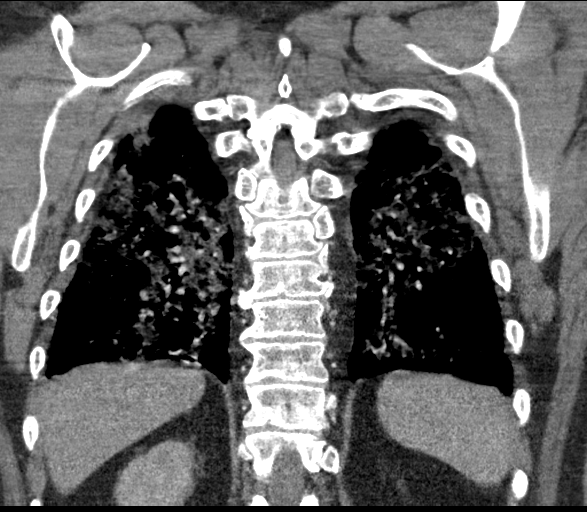

[15 of 46 positions shown; findings below may reference images not displayed]

FINDINGS: Cardiovascular: Satisfactory opacification of the pulmonary arteries
to the segmental level. Suggestion of vascular cut offs within the
right lower lobe; however, limited evaluation due to timing of
contrast. The main pulmonary artery measures at the upper limits of
normal (3 cm). No evidence of pulmonary embolism. Normal heart size.
No significant pericardial effusion. The ascending thoracic aorta is
stable in caliber measuring to 3.9 cm. The descending thoracic aorta
is normal in caliber. No thoracic aorta atherosclerotic plaque. No
definite coronary artery calcification.

Mediastinum/Nodes: Multiple prominent as well as several mildly
enlarged mediastinal lymph nodes with as an example a 1.1 cm right
paratracheal lymph node ([DATE]), and a right precarinal lymph node
measuring 1.2 cm ([DATE]), as well as a 0.9 cm prevascular lymph node
([DATE]). Several posterior mediastinal lymph nodes are prominent size
with ascending example a 0.8 cm ([DATE]) and 0.9 cm ([DATE]) periaortic
lymph nodes. Bilateral enlarged perihilar lymphadenopathy with as an
example a 1.2 cm right ([DATE]) and a 0.9 cm left ([DATE]) lymph node.
No axillary lymphadenopathy.

Lungs/Pleura: Extensive diffuse peribronchovascular and peripheral
patchy consolidative opacities. Linear atelectasis versus scarring
in the left lower lobe. No definite pulmonary mass. Central airways
are patent.

Upper Abdomen: No acute abnormality.

Musculoskeletal:

Interval development of asymmetric right greater than left
gynecomastia.

No suspicious lytic or blastic osseous lesions. No acute displaced
fracture. Multilevel degenerative changes of the spine.

Review of the MIP images confirms the above findings.
IMPRESSION: 1. No central or segmental pulmonary embolus. Limited evaluation at
the subsegmental level due to timing of contrast.
2. Extensive diffuse consolidative peribronchovascular and
peripheral consolidations consistent with bilateral pneumonia in a
patient with known 1TS49-IT infection.
3. Likely reactive mediastinal and hilar lymphadenopathy.
4. Interval development of asymmetric right greater than left
gynecomastia. Given asymmetry, consider non-emergent
mammographic/sonographic evaluation.

ADDENDUM:
These results (specifically regarding asymmetric gynecomastia) were
called by telephone at the time of interpretation on 09/18/2019 at
[DATE] to provider TOMEKA ORAMA , who verbally acknowledged these
results.

*** End of Addendum ***
FINDINGS: Cardiovascular: Satisfactory opacification of the pulmonary arteries
to the segmental level. Suggestion of vascular cut offs within the
right lower lobe; however, limited evaluation due to timing of
contrast. The main pulmonary artery measures at the upper limits of
normal (3 cm). No evidence of pulmonary embolism. Normal heart size.
No significant pericardial effusion. The ascending thoracic aorta is
stable in caliber measuring to 3.9 cm. The descending thoracic aorta
is normal in caliber. No thoracic aorta atherosclerotic plaque. No
definite coronary artery calcification.

Mediastinum/Nodes: Multiple prominent as well as several mildly
enlarged mediastinal lymph nodes with as an example a 1.1 cm right
paratracheal lymph node ([DATE]), and a right precarinal lymph node
measuring 1.2 cm ([DATE]), as well as a 0.9 cm prevascular lymph node
([DATE]). Several posterior mediastinal lymph nodes are prominent size
with ascending example a 0.8 cm ([DATE]) and 0.9 cm ([DATE]) periaortic
lymph nodes. Bilateral enlarged perihilar lymphadenopathy with as an
example a 1.2 cm right ([DATE]) and a 0.9 cm left ([DATE]) lymph node.
No axillary lymphadenopathy.

Lungs/Pleura: Extensive diffuse peribronchovascular and peripheral
patchy consolidative opacities. Linear atelectasis versus scarring
in the left lower lobe. No definite pulmonary mass. Central airways
are patent.

Upper Abdomen: No acute abnormality.

Musculoskeletal:

Interval development of asymmetric right greater than left
gynecomastia.

No suspicious lytic or blastic osseous lesions. No acute displaced
fracture. Multilevel degenerative changes of the spine.

Review of the MIP images confirms the above findings.
IMPRESSION: 1. No central or segmental pulmonary embolus. Limited evaluation at
the subsegmental level due to timing of contrast.
2. Extensive diffuse consolidative peribronchovascular and
peripheral consolidations consistent with bilateral pneumonia in a
patient with known 1TS49-IT infection.
3. Likely reactive mediastinal and hilar lymphadenopathy.
4. Interval development of asymmetric right greater than left
gynecomastia. Given asymmetry, consider non-emergent
mammographic/sonographic evaluation.

## 2021-09-14 IMAGING — DX DG CHEST 1V PORT
1 series · 1 of 1 positions shown · non-contrast
Comparison: 09/15/2019

CLINICAL DATA: Hypoxia, shortness of breath, presumptive COVID 19

EXAM:
PORTABLE CHEST 1 VIEW

[chest ap]
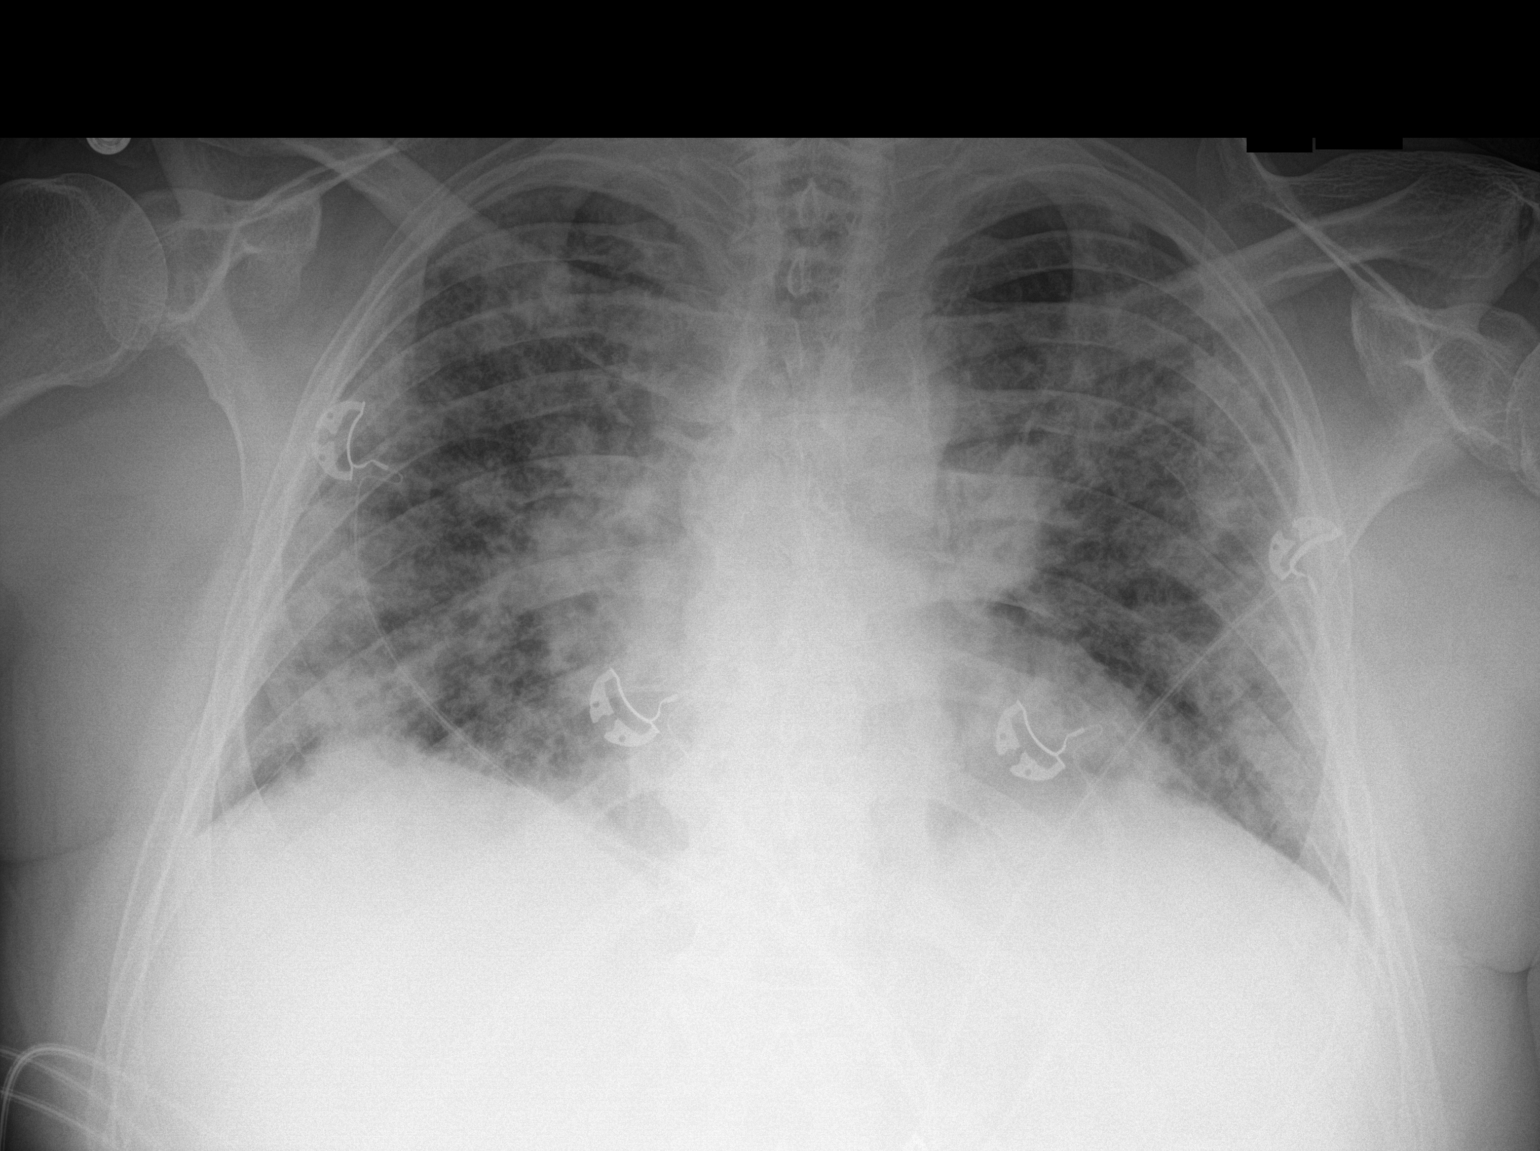

[1 of 1 positions shown; findings below may reference images not displayed]

FINDINGS: Single frontal view of the chest demonstrates a stable cardiac
silhouette. Lung volumes are diminished, with slight progression of
multifocal bilateral airspace disease. No effusion or pneumothorax.
IMPRESSION: 1. Decreased lung volumes, with progression of bilateral multifocal
pneumonia.

## 2022-02-09 ENCOUNTER — Encounter: Payer: Self-pay | Admitting: Nurse Practitioner

## 2022-02-09 ENCOUNTER — Ambulatory Visit: Payer: Self-pay | Admitting: Nurse Practitioner

## 2022-02-09 NOTE — Progress Notes (Deleted)
   Subjective:

## 2022-06-10 ENCOUNTER — Other Ambulatory Visit: Payer: Self-pay | Admitting: Nurse Practitioner

## 2022-06-16 ENCOUNTER — Telehealth: Payer: Self-pay | Admitting: Nurse Practitioner

## 2022-06-16 DIAGNOSIS — F320 Major depressive disorder, single episode, mild: Secondary | ICD-10-CM

## 2022-06-16 MED ORDER — SERTRALINE HCL 100 MG PO TABS
100.0000 mg | ORAL_TABLET | Freq: Every day | ORAL | 0 refills | Status: DC
Start: 2022-06-16 — End: 2022-09-14

## 2022-06-16 NOTE — Telephone Encounter (Signed)
  Prescription Request  06/16/2022  What is the name of the medication or equipment? SERTRALINE  Have you contacted your pharmacy to request a refill? YES  Which pharmacy would you like this sent to? [facility] [place]  Pt scheduled an appt for med refill on 7/12 (first available) and is requesting refill to last him until his appt.

## 2022-06-16 NOTE — Telephone Encounter (Signed)
Aware refill sent to pharmacy till his appt in July

## 2022-07-17 ENCOUNTER — Ambulatory Visit: Payer: Managed Care, Other (non HMO) | Admitting: Nurse Practitioner

## 2022-08-20 ENCOUNTER — Ambulatory Visit: Payer: Managed Care, Other (non HMO) | Admitting: Nurse Practitioner

## 2022-09-14 ENCOUNTER — Encounter: Payer: Self-pay | Admitting: Nurse Practitioner

## 2022-09-14 ENCOUNTER — Ambulatory Visit: Payer: Managed Care, Other (non HMO) | Admitting: Nurse Practitioner

## 2022-09-14 VITALS — BP 119/84 | HR 106 | Temp 97.1°F | Resp 20 | Ht 71.0 in | Wt 286.0 lb

## 2022-09-14 DIAGNOSIS — Z125 Encounter for screening for malignant neoplasm of prostate: Secondary | ICD-10-CM | POA: Diagnosis not present

## 2022-09-14 DIAGNOSIS — Z720 Tobacco use: Secondary | ICD-10-CM

## 2022-09-14 DIAGNOSIS — K5909 Other constipation: Secondary | ICD-10-CM

## 2022-09-14 DIAGNOSIS — F320 Major depressive disorder, single episode, mild: Secondary | ICD-10-CM

## 2022-09-14 DIAGNOSIS — E669 Obesity, unspecified: Secondary | ICD-10-CM

## 2022-09-14 MED ORDER — SERTRALINE HCL 100 MG PO TABS
100.0000 mg | ORAL_TABLET | Freq: Every day | ORAL | 1 refills | Status: DC
Start: 2022-09-14 — End: 2023-03-23

## 2022-09-14 MED ORDER — CLONAZEPAM 0.5 MG PO TABS
0.5000 mg | ORAL_TABLET | Freq: Two times a day (BID) | ORAL | 0 refills | Status: DC | PRN
Start: 2022-09-14 — End: 2023-03-23

## 2022-09-14 MED ORDER — LINACLOTIDE 290 MCG PO CAPS
290.0000 ug | ORAL_CAPSULE | Freq: Every day | ORAL | 4 refills | Status: DC
Start: 2022-09-14 — End: 2023-03-23

## 2022-09-14 NOTE — Patient Instructions (Signed)

## 2022-09-14 NOTE — Progress Notes (Signed)
Subjective:    Patient ID: Shawn [name], male    DOB: Oct 25, 1970, 52 y.o.   MRN: [id number]   Chief Complaint: medical management of chronic issues     HPI:  Shawn [name] is a 52 y.o. who identifies as a male who was assigned male at birth.   Social history: Lives with: wife Work history: Merchandiser, retail at [employer] in today for follow up of the following chronic medical issues:  1. Chronic constipation Is on linzess daily and is doing well.  2. Depression, major, single episode, mild (HCC) Is on zoloft and is doing Ok. No medication side effects. Says he had a friend that died recently and he has a lot going on with his family. He has felt suicidal but says he would not do anything. He wants  to tsya on current meds because he thinks they help.  3. Tobacco abuse Smokes about 1/2 pack a day  4. Obesity, Class II, BMI 35-39.9 No recent weight changes Wt Readings from Last 3 Encounters:  09/14/22 286 lb (129.7 kg)  08/20/21 283 lb (128.4 kg)  08/07/21 285 lb (129.3 kg)   BMI Readings from Last 3 Encounters:  09/14/22 39.89 kg/m  08/20/21 39.47 kg/m  08/07/21 39.75 kg/m      New complaints: None today  Allergies  Allergen Reactions   Ativan [Lorazepam] Other (See Comments)    Agitation, aggressive   Codeine Other (See Comments)    HALLUCINATIONS   Outpatient Encounter Medications as of 09/14/2022  Medication Sig   albuterol (VENTOLIN HFA) 108 (90 Base) MCG/ACT inhaler INHALE 1-2 PUFFS INTO THE LUNGS EVERY 4 HOURS AS NEEDED FOR WHEEZING OR SHORTNESS OF BREATH.   clonazePAM (KLONOPIN) 0.5 MG tablet Take 1 tablet (0.5 mg total) by mouth 2 (two) times daily as needed for anxiety.   levofloxacin (LEVAQUIN) 500 MG tablet Take 1 tablet (500 mg total) by mouth daily.   linaclotide (LINZESS) 290 MCG CAPS capsule Take 1 capsule (290 mcg total) by mouth daily before breakfast.   MELATONIN PO Take by mouth.   sertraline (ZOLOFT) 100 MG tablet Take 1 tablet (100 mg  total) by mouth daily.   No facility-administered encounter medications on file as of 09/14/2022.    History reviewed. No pertinent surgical history.  Family History  Problem Relation Age of Onset   Diabetes Maternal Grandmother    Alzheimer's disease Paternal Grandmother    Cancer Paternal Grandfather        THROAT      Controlled substance contract: n/a     Review of Systems  Constitutional:  Negative for diaphoresis.  Eyes:  Negative for pain.  Respiratory:  Negative for shortness of breath.   Cardiovascular:  Negative for chest pain, palpitations and leg swelling.  Gastrointestinal:  Negative for abdominal pain.  Endocrine: Negative for polydipsia.  Skin:  Negative for rash.  Neurological:  Negative for dizziness, weakness and headaches.  Hematological:  Does not bruise/bleed easily.  All other systems reviewed and are negative.      Objective:   Physical Exam Vitals and nursing note reviewed.  Constitutional:      Appearance: Normal appearance. He is well-developed.  HENT:     Head: Normocephalic.     Nose: Nose normal.     Mouth/Throat:     Mouth: Mucous membranes are moist.     Pharynx: Oropharynx is clear.  Eyes:     Pupils: Pupils are equal, round, and reactive to light.  Neck:  Thyroid: No thyroid mass or thyromegaly.     Vascular: No carotid bruit or JVD.     Trachea: Phonation normal.  Cardiovascular:     Rate and Rhythm: Normal rate and regular rhythm.  Pulmonary:     Effort: Pulmonary effort is normal. No respiratory distress.     Breath sounds: Normal breath sounds.  Abdominal:     General: Bowel sounds are normal.     Palpations: Abdomen is soft.     Tenderness: There is no abdominal tenderness.  Musculoskeletal:        General: Normal range of motion.     Cervical back: Normal range of motion and neck supple.  Lymphadenopathy:     Cervical: No cervical adenopathy.  Skin:    General: Skin is warm and dry.  Neurological:     Mental  Status: He is alert and oriented to person, place, and time.  Psychiatric:        Behavior: Behavior normal.        Thought Content: Thought content normal.        Judgment: Judgment normal.    BP 119/84   Pulse (!) 106   Temp (!) 97.1 F (36.2 C) (Temporal)   Resp 20   Ht 5\' 11"  (1.803 m)   Wt 286 lb (129.7 kg)   SpO2 95%   BMI 39.89 kg/m         Assessment & Plan:   [name] comes in today with chief complaint of Medical Management of Chronic Issues   Diagnosis and orders addressed:  1. Chronic constipation Increase fiber in diet - linaclotide (LINZESS) 290 MCG CAPS capsule; Take 1 capsule (290 mcg total) by mouth daily before breakfast.  Dispense: 90 capsule; Refill: 4 - CBC with Differential/Platelet - CMP14+EGFR - Lipid panel  2. Depression, major, single episode, mild (HCC) Stress management Patient does not want to change meds - sertraline (ZOLOFT) 100 MG tablet; Take 1 tablet (100 mg total) by mouth daily.  Dispense: 90 tablet; Refill: 1 - clonazePAM (KLONOPIN) 0.5 MG tablet; Take 1 tablet (0.5 mg total) by mouth 2 (two) times daily as needed for anxiety.  Dispense: 20 tablet; Refill: 0  3. Tobacco abuse Smoking cessation encouraged  4. Obesity, Class II, BMI 35-39.9 Discussed diet and exercise for person with BMI >25 Will recheck weight in 3-6 months   5. Prostate cancer screening Labs pending - PSA, total and free   Labs pending Health Maintenance reviewed Diet and exercise encouraged  Follow up plan: 6 months   [name], FNP

## 2022-09-15 LAB — CBC WITH DIFFERENTIAL/PLATELET
Basophils Absolute: 0.1 10*3/uL (ref 0.0–0.2)
Basos: 1 %
EOS (ABSOLUTE): 0.3 10*3/uL (ref 0.0–0.4)
Eos: 5 %
Hematocrit: 50 % (ref 37.5–51.0)
Hemoglobin: 16.2 g/dL (ref 13.0–17.7)
Immature Grans (Abs): 0 10*3/uL (ref 0.0–0.1)
Immature Granulocytes: 0 %
Lymphocytes Absolute: 1.8 10*3/uL (ref 0.7–3.1)
Lymphs: 27 %
MCH: 29.1 pg (ref 26.6–33.0)
MCHC: 32.4 g/dL (ref 31.5–35.7)
MCV: 90 fL (ref 79–97)
Monocytes Absolute: 0.6 10*3/uL (ref 0.1–0.9)
Monocytes: 9 %
Neutrophils Absolute: 3.9 10*3/uL (ref 1.4–7.0)
Neutrophils: 58 %
Platelets: 201 10*3/uL (ref 150–450)
RBC: 5.57 x10E6/uL (ref 4.14–5.80)
RDW: 13.2 % (ref 11.6–15.4)
WBC: 6.6 10*3/uL (ref 3.4–10.8)

## 2022-09-15 LAB — CMP14+EGFR
ALT: 22 IU/L (ref 0–44)
AST: 20 IU/L (ref 0–40)
Albumin: 4.3 g/dL (ref 3.8–4.9)
Alkaline Phosphatase: 107 IU/L (ref 44–121)
BUN/Creatinine Ratio: 13 (ref 9–20)
BUN: 15 mg/dL (ref 6–24)
Bilirubin Total: 0.3 mg/dL (ref 0.0–1.2)
CO2: 25 mmol/L (ref 20–29)
Calcium: 9.8 mg/dL (ref 8.7–10.2)
Chloride: 102 mmol/L (ref 96–106)
Creatinine, Ser: 1.2 mg/dL (ref 0.76–1.27)
Globulin, Total: 2.4 g/dL (ref 1.5–4.5)
Glucose: 89 mg/dL (ref 70–99)
Potassium: 5 mmol/L (ref 3.5–5.2)
Sodium: 141 mmol/L (ref 134–144)
Total Protein: 6.7 g/dL (ref 6.0–8.5)
eGFR: 73 mL/min/{1.73_m2} (ref >59)

## 2022-09-15 LAB — LIPID PANEL
Chol/HDL Ratio: 4.9 ratio (ref 0.0–5.0)
Cholesterol, Total: 227 mg/dL — ABNORMAL HIGH (ref 100–199)
HDL: 46 mg/dL (ref >39)
LDL Chol Calc (NIH): 106 mg/dL — ABNORMAL HIGH (ref 0–99)
Triglycerides: 438 mg/dL — ABNORMAL HIGH (ref 0–149)
VLDL Cholesterol Cal: 75 mg/dL — ABNORMAL HIGH (ref 5–40)

## 2022-09-15 LAB — PSA, TOTAL AND FREE
PSA, Free Pct: 26.7 %
PSA, Free: 0.4 ng/mL
Prostate Specific Ag, Serum: 1.5 ng/mL (ref 0.0–4.0)

## 2023-03-15 ENCOUNTER — Ambulatory Visit: Payer: Managed Care, Other (non HMO) | Admitting: Nurse Practitioner

## 2023-03-15 NOTE — Progress Notes (Deleted)
 Subjective:    Patient ID: Shawn [name], male    DOB: 1970/09/19, 53 y.o.   MRN: [id number]   Chief Complaint: medical management of chronic issues     HPI:  Shawn [name] is a 53 y.o. who identifies as a male who was assigned male at birth.   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 Chronic constipation Is on linzess as needed and is working well.  2. Elevated LFTs Lab Results  Component Value Date   ALT 22 09/14/2022   AST 20 09/14/2022   ALKPHOS 107 09/14/2022   BILITOT 0.3 09/14/2022     3. Depression, major, single episode, mild (HCC) Is on zoloft daily and is not doing well. He says he is very depressed. ***   4. Tobacco abuse Smokes but only 1-2 cigarettes a day. Helps keep him calm at times.  5. Obesity, Class II, BMI 35-39.9 No recent weight changes  ***   New complaints: Left breast mass- tender to touch  Allergies  Allergen Reactions   Ativan [Lorazepam] Other (See Comments)    Agitation, aggressive   Codeine Other (See Comments)    HALLUCINATIONS   Outpatient Encounter Medications as of 03/15/2023  Medication Sig   albuterol (VENTOLIN HFA) 108 (90 Base) MCG/ACT inhaler INHALE 1-2 PUFFS INTO THE LUNGS EVERY 4 HOURS AS NEEDED FOR WHEEZING OR SHORTNESS OF BREATH.   clonazePAM (KLONOPIN) 0.5 MG tablet Take 1 tablet (0.5 mg total) by mouth 2 (two) times daily as needed for anxiety.   diphenhydrAMINE (BENADRYL) 25 mg capsule Take 25 mg by mouth at bedtime as needed.   linaclotide (LINZESS) 290 MCG CAPS capsule Take 1 capsule (290 mcg total) by mouth daily before breakfast.   MELATONIN PO Take by mouth.   sertraline (ZOLOFT) 100 MG tablet Take 1 tablet (100 mg total) by mouth daily.   No facility-administered encounter medications on file as of 03/15/2023.    No past surgical history on  file.  Family History  Problem Relation Age of Onset   Diabetes Maternal Grandmother    Alzheimer's disease Paternal Grandmother    Cancer Paternal Grandfather        THROAT      Controlled substance contract: n/a     Review of Systems  Constitutional:  Negative for diaphoresis.  Eyes:  Negative for pain.  Respiratory:  Negative for shortness of breath.   Cardiovascular:  Negative for chest pain, palpitations and leg swelling.  Gastrointestinal:  Negative for abdominal pain.  Endocrine: Negative for polydipsia.  Skin:  Negative for rash.  Neurological:  Negative for dizziness, weakness and headaches.  Hematological:  Does not bruise/bleed easily.  All other systems reviewed and are negative.      Objective:    Physical Exam Constitutional:      Appearance: Normal appearance.  HENT:     Head: Normocephalic.     Nose: Nose normal.  Eyes:     Pupils: Pupils are equal, round, and reactive to light.  Neck:     Thyroid: No thyroid mass or thyromegaly.     Vascular:  No carotid bruit or JVD.     Trachea: Phonation normal.  Cardiovascular:     Rate and Rhythm: Normal rate and regular rhythm.  Pulmonary:     Effort: Pulmonary effort is normal. No respiratory distress.     Breath sounds: Normal breath sounds.  Abdominal:     General: Bowel sounds are normal.     Palpations: Abdomen is soft.     Tenderness: There is no abdominal tenderness.  Musculoskeletal:        General: Normal range of motion.     Cervical back: Normal range of motion and neck supple.  Lymphadenopathy:     Cervical: No cervical adenopathy.  Skin:    General: Skin is warm and dry.  Neurological:     Mental Status: He is alert and oriented to person, place, and time.     Gait: Gait is intact.  Psychiatric:        Mood and Affect: Mood and affect normal.        Cognition and Memory: Memory normal.        Judgment: Judgment normal.     There were no vitals taken for this visit.        Assessment & Plan:  Shawn [name] comes in today with chief complaint of No chief complaint on file.   Diagnosis and orders addressed:  1. Chronic constipation Increase fiber in diet - linaclotide (LINZESS) 290 MCG CAPS capsule; Take 1 capsule (290 mcg total) by mouth daily before breakfast.  Dispense: 90 capsule; Refill: 4  2. Elevated LFTs - CBC with Differential/Platelet - CMP14+EGFR - Lipid panel  3. Depression, major, single episode, mild (HCC) Stress management Increase zoloft to 100mg  a day - sertraline (ZOLOFT) 100 MG tablet; Take 1 tablet (100 mg total) by mouth daily.  Dispense: 90 tablet; Refill: 1  4. Tobacco abuse Smoking cessation   5. Obesity, Class II, BMI 35-39.9 Discussed diet and exercise for person with BMI >25 Will recheck weight in 3-6 months   6. Mass of upper outer quadrant of left breast - US BREAST COMPLETE UNI LEFT INC AXILLA   Labs pending Health Maintenance reviewed Diet and exercise encouraged  Follow up plan: 6 months   [name], FNP

## 2023-03-19 ENCOUNTER — Encounter: Payer: Self-pay | Admitting: Nurse Practitioner

## 2023-03-23 ENCOUNTER — Ambulatory Visit

## 2023-03-23 ENCOUNTER — Ambulatory Visit (INDEPENDENT_AMBULATORY_CARE_PROVIDER_SITE_OTHER)

## 2023-03-23 ENCOUNTER — Encounter: Payer: Self-pay | Admitting: Nurse Practitioner

## 2023-03-23 ENCOUNTER — Ambulatory Visit: Admitting: Nurse Practitioner

## 2023-03-23 VITALS — BP 139/88 | HR 97 | Temp 97.8°F | Ht 71.0 in | Wt 298.3 lb

## 2023-03-23 DIAGNOSIS — Z0000 Encounter for general adult medical examination without abnormal findings: Secondary | ICD-10-CM | POA: Diagnosis not present

## 2023-03-23 DIAGNOSIS — Z6841 Body Mass Index (BMI) 40.0 and over, adult: Secondary | ICD-10-CM

## 2023-03-23 DIAGNOSIS — K5909 Other constipation: Secondary | ICD-10-CM

## 2023-03-23 DIAGNOSIS — Z86711 Personal history of pulmonary embolism: Secondary | ICD-10-CM

## 2023-03-23 DIAGNOSIS — F320 Major depressive disorder, single episode, mild: Secondary | ICD-10-CM | POA: Diagnosis not present

## 2023-03-23 DIAGNOSIS — Z0001 Encounter for general adult medical examination with abnormal findings: Secondary | ICD-10-CM | POA: Diagnosis not present

## 2023-03-23 DIAGNOSIS — L2084 Intrinsic (allergic) eczema: Secondary | ICD-10-CM | POA: Diagnosis not present

## 2023-03-23 DIAGNOSIS — E66812 Obesity, class 2: Secondary | ICD-10-CM

## 2023-03-23 DIAGNOSIS — Z720 Tobacco use: Secondary | ICD-10-CM

## 2023-03-23 MED ORDER — LINACLOTIDE 290 MCG PO CAPS: 290.0000 ug | ORAL_CAPSULE | Freq: Every day | ORAL | 4 refills | Status: AC

## 2023-03-23 MED ORDER — SERTRALINE HCL 100 MG PO TABS: 100.0000 mg | ORAL_TABLET | Freq: Every day | ORAL | 1 refills | Status: AC

## 2023-03-23 MED ORDER — CLONAZEPAM 0.5 MG PO TABS: 0.5000 mg | ORAL_TABLET | Freq: Two times a day (BID) | ORAL | 0 refills | Status: AC | PRN

## 2023-03-23 MED ORDER — TRIAMCINOLONE ACETONIDE 0.1 % EX CREA: 1.0000 | TOPICAL_CREAM | Freq: Two times a day (BID) | CUTANEOUS | 1 refills | Status: AC

## 2023-03-23 MED ORDER — WEGOVY 0.25 MG/0.5ML ~~LOC~~ SOAJ
0.2500 mg | SUBCUTANEOUS | 2 refills | Status: DC
Start: 2023-03-23 — End: 2023-04-29

## 2023-03-23 NOTE — Patient Instructions (Signed)
 Exercising to Stay Healthy To become healthy and stay healthy, it is recommended that you do moderate-intensity and vigorous-intensity exercise. You can tell that you are exercising at a moderate intensity if your heart starts beating faster and you start breathing faster but can still hold a conversation. You can tell that you are exercising at a vigorous intensity if you are breathing much harder and faster and cannot hold a conversation while exercising. How can exercise benefit me? Exercising regularly is important. It has many health benefits, such as: Improving overall fitness, flexibility, and endurance. Increasing bone density. Helping with weight control. Decreasing body fat. Increasing muscle strength and endurance. Reducing stress and tension, anxiety, depression, or anger. Improving overall health. What guidelines should I follow while exercising? Before you start a new exercise program, talk with your health care provider. Do not exercise so much that you hurt yourself, feel dizzy, or get very short of breath. Wear comfortable clothes and wear shoes with good support. Drink plenty of water while you exercise to prevent dehydration or heat stroke. Work out until your breathing and your heartbeat get faster (moderate intensity). How often should I exercise? Choose an activity that you enjoy, and set realistic goals. Your health care provider can help you make an activity plan that is individually designed and works best for you. Exercise regularly as told by your health care provider. This may include: Doing strength training two times a week, such as: Lifting weights. Using resistance bands. Push-ups. Sit-ups. Yoga. Doing a certain intensity of exercise for a given amount of time. Choose from these options: A total of 150 minutes of moderate-intensity exercise every week. A total of 75 minutes of vigorous-intensity exercise every week. A mix of moderate-intensity and  vigorous-intensity exercise every week. Children, pregnant women, people who have not exercised regularly, people who are overweight, and older adults may need to talk with a health care provider about what activities are safe to perform. If you have a medical condition, be sure to talk with your health care provider before you start a new exercise program. What are some exercise ideas? Moderate-intensity exercise ideas include: Walking 1 mile (1.6 km) in about 15 minutes. Biking. Hiking. Golfing. Dancing. Water aerobics. Vigorous-intensity exercise ideas include: Walking 4.5 miles (7.2 km) or more in about 1 hour. Jogging or running 5 miles (8 km) in about 1 hour. Biking 10 miles (16.1 km) or more in about 1 hour. Lap swimming. Roller-skating or in-line skating. Cross-country skiing. Vigorous competitive sports, such as football, basketball, and soccer. Jumping rope. Aerobic dancing. What are some everyday activities that can help me get exercise? Yard work, such as: Child psychotherapist. Raking and bagging leaves. Washing your car. Pushing a stroller. Shoveling snow. Gardening. Washing windows or floors. How can I be more active in my day-to-day activities? Use stairs instead of an elevator. Take a walk during your lunch break. If you drive, park your car farther away from your work or school. If you take public transportation, get off one stop early and walk the rest of the way. Stand up or walk around during all of your indoor phone calls. Get up, stretch, and walk around every 30 minutes throughout the day. Enjoy exercise with a friend. Support to continue exercising will help you keep a regular routine of activity. Where to find more information You can find more information about exercising to stay healthy from: U.S. Department of Health and Human Services: ThisPath.fi Centers for Disease Control and Prevention (  CDC): FootballExhibition.com.br Summary Exercising regularly is  important. It will improve your overall fitness, flexibility, and endurance. Regular exercise will also improve your overall health. It can help you control your weight, reduce stress, and improve your bone density. Do not exercise so much that you hurt yourself, feel dizzy, or get very short of breath. Before you start a new exercise program, talk with your health care provider. This information is not intended to replace advice given to you by your health care provider. Make sure you discuss any questions you have with your health care provider. Document Revised: 04/19/2020 Document Reviewed: 04/19/2020 Elsevier Patient Education  2024 [employer].

## 2023-03-23 NOTE — Progress Notes (Signed)
 Subjective:    Patient ID: Shawn [name], male    DOB: [birth year], 53 y.o.   MRN: [id number]   Chief Complaint: annual physical    HPI:  Shawn [name] is a 53 y.o. who identifies as a male who was assigned male at birth.   Social history: Lives with: wife Work history: Merchandiser, retail at [employer] in today for follow up of the following chronic medical issues:  1. Chronic constipation Is on linzess daily and is doing well.  2. Depression, major, single episode, mild (HCC) Is on zoloft and is doing Ok. No medication side effects. Says he had a friend that died recently and he has a lot going on with his family. He has felt suicidal but says he would not do anything. He wants  to tsya on current meds because he thinks they help.    09/14/2022    3:22 PM 08/20/2021    8:00 AM 08/07/2021    8:20 AM 11/21/2020    8:30 AM 01/29/2020   12:23 PM  Depression screen PHQ 2/9  Decreased Interest 0 1 3 2  0  Down, Depressed, Hopeless 1 1 3 2  0  PHQ - 2 Score 1 2 6 4  0  Altered sleeping 3 1 3 3    Tired, decreased energy 2 1 3 3    Change in appetite 3 1 3 3    Feeling bad or failure about yourself  3 1 3 2    Trouble concentrating 2 1 3 3    Moving slowly or fidgety/restless 0 1 3 2    Suicidal thoughts 1 1 1 2    PHQ-9 Score 15 9 25 22    Difficult doing work/chores Somewhat difficult Somewhat difficult Somewhat difficult Somewhat difficult      3. Tobacco abuse Smokes about 1/2 pack a day  4. Obesity, Class II, BMI 35-39.9 Weight is up 12 lbs.  Wt Readings from Last 3 Encounters:  03/23/23 298 lb 4.8 oz (135.3 kg)  09/14/22 286 lb (129.7 kg)  08/20/21 283 lb (128.4 kg)   BMI Readings from Last 3 Encounters:  03/23/23 41.60 kg/m  09/14/22 39.89 kg/m  08/20/21 39.47 kg/m       New complaints: None today  Allergies  Allergen Reactions   Ativan [Lorazepam] Other (See Comments)    Agitation, aggressive   Codeine Other (See Comments)    HALLUCINATIONS   Outpatient  Encounter Medications as of 03/23/2023  Medication Sig   albuterol (VENTOLIN HFA) 108 (90 Base) MCG/ACT inhaler INHALE 1-2 PUFFS INTO THE LUNGS EVERY 4 HOURS AS NEEDED FOR WHEEZING OR SHORTNESS OF BREATH.   clonazePAM (KLONOPIN) 0.5 MG tablet Take 1 tablet (0.5 mg total) by mouth 2 (two) times daily as needed for anxiety.   diphenhydrAMINE (BENADRYL) 25 mg capsule Take 25 mg by mouth at bedtime as needed.   linaclotide (LINZESS) 290 MCG CAPS capsule Take 1 capsule (290 mcg total) by mouth daily before breakfast.   MELATONIN PO Take by mouth.   sertraline (ZOLOFT) 100 MG tablet Take 1 tablet (100 mg total) by mouth daily.   No facility-administered encounter medications on file as of 03/23/2023.    No past surgical history on file.  Family History  Problem Relation Age of Onset   Diabetes Maternal Grandmother    Alzheimer's disease Paternal Grandmother    Cancer Paternal Grandfather        THROAT      Controlled substance contract: n/a     Review of Systems  Constitutional:  Negative for diaphoresis.  Eyes:  Negative for pain.  Respiratory:  Negative for shortness of breath.   Cardiovascular:  Negative for chest pain, palpitations and leg swelling.  Gastrointestinal:  Negative for abdominal pain.  Endocrine: Negative for polydipsia.  Skin:  Negative for rash.  Neurological:  Negative for dizziness, weakness and headaches.  Hematological:  Does not bruise/bleed easily.  All other systems reviewed and are negative.      Objective:   Physical Exam Vitals and nursing note reviewed.  Constitutional:      Appearance: Normal appearance. He is well-developed.  HENT:     Head: Normocephalic.     Nose: Nose normal.     Mouth/Throat:     Mouth: Mucous membranes are moist.     Pharynx: Oropharynx is clear.  Eyes:     Pupils: Pupils are equal, round, and reactive to light.  Neck:     Thyroid: No thyroid mass or thyromegaly.     Vascular: No carotid bruit or JVD.      Trachea: Phonation normal.  Cardiovascular:     Rate and Rhythm: Normal rate and regular rhythm.  Pulmonary:     Effort: Pulmonary effort is normal. No respiratory distress.     Breath sounds: Normal breath sounds.  Abdominal:     General: Bowel sounds are normal.     Palpations: Abdomen is soft.     Tenderness: There is no abdominal tenderness.  Musculoskeletal:        General: Normal range of motion.     Cervical back: Normal range of motion and neck supple.  Lymphadenopathy:     Cervical: No cervical adenopathy.  Skin:    General: Skin is warm and dry.     Comments: Erythematous maculopapular rash on right calf rea.  Neurological:     Mental Status: He is alert and oriented to person, place, and time.  Psychiatric:        Behavior: Behavior normal.        Thought Content: Thought content normal.        Judgment: Judgment normal.    BP 139/88   Pulse 97   Temp 97.8 F (36.6 C)   Ht 5\' 11"  (1.803 m)   Wt 298 lb 4.8 oz (135.3 kg)   SpO2 97%   BMI 41.60 kg/m   EKG- NSR-[name], FNP  Chest xray- pending radiology    Assessment & Plan:  Shawn [name] comes in today with chief complaint of Medical Management of Chronic Issues (Pt would like to talk about weight loss medication. Pt has a itchy red rash on the back of right leg. Pt stopped taking all depression medication, states "it made me feel weird and I feel better without it")   Diagnosis and orders addressed:  1. Annual physical exam (Primary) - EKG 12-Lead - DG Chest 2 View - CBC with Differential/Platelet - CMP14+EGFR - Lipid panel - PSA, total and free  2. Chronic constipation Increase fiber in diet - linaclotide (LINZESS) 290 MCG CAPS capsule; Take 1 capsule (290 mcg total) by mouth daily before breakfast.  Dispense: 90 capsule; Refill: 4  3. Depression, major, single episode, mild (HCC) Stress management - clonazePAM (KLONOPIN) 0.5 MG tablet; Take 1 tablet (0.5 mg total) by mouth 2 (two)  times daily as needed for anxiety.  Dispense: 20 tablet; Refill: 0 - sertraline (ZOLOFT) 100 MG tablet; Take 1 tablet (100 mg total) by mouth daily.  Dispense: 90 tablet; Refill: 1  4. Tobacco  abuse Smoking cessation encouraged  5. Obesity, Class II, BMI 35-39.9 May try zepbound if wegovy denied since has history of sleep apnea - Semaglutide-Weight Management (WEGOVY) 0.25 MG/0.5ML SOAJ; Inject 0.25 mg into the skin once a week.  Dispense: 2 mL; Refill: 2  6. Intrinsic eczema Avoid scratching - triamcinolone cream (KENALOG) 0.1 %; Apply 1 Application topically 2 (two) times daily.  Dispense: 453 g; Refill: 1   Labs pending Health Maintenance reviewed Diet and exercise encouraged  Follow up plan: 6 months   [name], FNP

## 2023-03-24 LAB — CBC WITH DIFFERENTIAL/PLATELET
Basophils Absolute: 0 10*3/uL (ref 0.0–0.2)
Basos: 1 %
EOS (ABSOLUTE): 0.3 10*3/uL (ref 0.0–0.4)
Eos: 4 %
Hematocrit: 48.2 % (ref 37.5–51.0)
Hemoglobin: 16.4 g/dL (ref 13.0–17.7)
Immature Grans (Abs): 0 10*3/uL (ref 0.0–0.1)
Immature Granulocytes: 0 %
Lymphocytes Absolute: 1.8 10*3/uL (ref 0.7–3.1)
Lymphs: 28 %
MCH: 30.7 pg (ref 26.6–33.0)
MCHC: 34 g/dL (ref 31.5–35.7)
MCV: 90 fL (ref 79–97)
Monocytes Absolute: 0.6 10*3/uL (ref 0.1–0.9)
Monocytes: 9 %
Neutrophils Absolute: 3.8 10*3/uL (ref 1.4–7.0)
Neutrophils: 58 %
Platelets: 214 10*3/uL (ref 150–450)
RBC: 5.35 x10E6/uL (ref 4.14–5.80)
RDW: 12.9 % (ref 11.6–15.4)
WBC: 6.5 10*3/uL (ref 3.4–10.8)

## 2023-03-24 LAB — LIPID PANEL
Chol/HDL Ratio: 5.1 ratio — ABNORMAL HIGH (ref 0.0–5.0)
Cholesterol, Total: 209 mg/dL — ABNORMAL HIGH (ref 100–199)
HDL: 41 mg/dL (ref >39)
LDL Chol Calc (NIH): 128 mg/dL — ABNORMAL HIGH (ref 0–99)
Triglycerides: 222 mg/dL — ABNORMAL HIGH (ref 0–149)
VLDL Cholesterol Cal: 40 mg/dL (ref 5–40)

## 2023-03-24 LAB — CMP14+EGFR
ALT: 21 IU/L (ref 0–44)
AST: 21 IU/L (ref 0–40)
Albumin: 4.4 g/dL (ref 3.8–4.9)
Alkaline Phosphatase: 106 IU/L (ref 44–121)
BUN/Creatinine Ratio: 13 (ref 9–20)
BUN: 17 mg/dL (ref 6–24)
Bilirubin Total: 0.3 mg/dL (ref 0.0–1.2)
CO2: 25 mmol/L (ref 20–29)
Calcium: 9.3 mg/dL (ref 8.7–10.2)
Chloride: 104 mmol/L (ref 96–106)
Creatinine, Ser: 1.29 mg/dL — ABNORMAL HIGH (ref 0.76–1.27)
Globulin, Total: 2.1 g/dL (ref 1.5–4.5)
Glucose: 89 mg/dL (ref 70–99)
Potassium: 4.3 mmol/L (ref 3.5–5.2)
Sodium: 143 mmol/L (ref 134–144)
Total Protein: 6.5 g/dL (ref 6.0–8.5)
eGFR: 66 mL/min/{1.73_m2} (ref >59)

## 2023-03-24 LAB — PSA, TOTAL AND FREE
PSA, Free Pct: 32 %
PSA, Free: 0.32 ng/mL
Prostate Specific Ag, Serum: 1 ng/mL (ref 0.0–4.0)

## 2023-03-25 ENCOUNTER — Telehealth: Payer: Self-pay | Admitting: Family Medicine

## 2023-03-25 DIAGNOSIS — Z8669 Personal history of other diseases of the nervous system and sense organs: Secondary | ICD-10-CM

## 2023-03-25 NOTE — Telephone Encounter (Signed)
 Copied from CRM [id number]. Topic: Clinical - Medication Question >> Mar 25, 2023 11:08 AM Shawn [name] wrote: Reason for CRM: pt called in to notify insurance denied the [facility] and want to go with the plan B, pt said there is another medication or sleep apnea study he can go through. Pt would like callback   Pt callback [phone]

## 2023-04-02 ENCOUNTER — Other Ambulatory Visit: Payer: Self-pay

## 2023-04-02 DIAGNOSIS — G4730 Sleep apnea, unspecified: Secondary | ICD-10-CM

## 2023-04-28 DIAGNOSIS — G4730 Sleep apnea, unspecified: Secondary | ICD-10-CM

## 2023-04-29 ENCOUNTER — Other Ambulatory Visit (HOSPITAL_COMMUNITY): Payer: Self-pay

## 2023-04-29 ENCOUNTER — Telehealth: Payer: Self-pay

## 2023-04-29 ENCOUNTER — Other Ambulatory Visit: Payer: Self-pay | Admitting: Nurse Practitioner

## 2023-04-29 DIAGNOSIS — G4730 Sleep apnea, unspecified: Secondary | ICD-10-CM

## 2023-04-29 MED ORDER — TIRZEPATIDE 2.5 MG/0.5ML ~~LOC~~ SOAJ: 2.5000 mg | SUBCUTANEOUS | 2 refills | Status: DC

## 2023-04-29 MED ORDER — TIRZEPATIDE-WEIGHT MANAGEMENT 2.5 MG/0.5ML ~~LOC~~ SOLN: 2.5000 mg | SUBCUTANEOUS | 2 refills | Status: DC

## 2023-04-29 NOTE — Telephone Encounter (Signed)
 Hello,        We have received a request to do a Prior Authorization for Mounjaro for ''sleep apnea''. However Mounjaro is exclusively used for DM2. Zepbound is the current Rx that is FDA approved for sleep apnea. Please advise.

## 2023-04-29 NOTE — Telephone Encounter (Signed)
  ZEPBOUND 2.5 MG/0.5ML Pen   Pharmacy comment: Alternative Requested:NOT COVERED; PLEASE CONTACT INSURANCE OR CONSIDER ALTERNATIVE.

## 2023-04-29 NOTE — Telephone Encounter (Signed)
 Had o change  prescription from wegovy  to [facility] and code it for sleep apnea. Will see if insurance will cover it this way.  Meds ordered this encounter  Medications   tirzepatide (MOUNJARO) 2.5 MG/0.5ML Pen    Sig: Inject 2.5 mg into the skin once a week.    Dispense:  2 mL    Refill:  2    Supervising Provider:   Jolyne Needs A [[id number]]   Mary-Margaret Gaylyn Keas, FNP

## 2023-04-30 ENCOUNTER — Telehealth: Payer: Self-pay

## 2023-04-30 ENCOUNTER — Other Ambulatory Visit: Payer: Self-pay | Admitting: Nurse Practitioner

## 2023-04-30 ENCOUNTER — Other Ambulatory Visit (HOSPITAL_COMMUNITY): Payer: Self-pay

## 2023-04-30 DIAGNOSIS — G4730 Sleep apnea, unspecified: Secondary | ICD-10-CM

## 2023-04-30 MED ORDER — TIRZEPATIDE 2.5 MG/0.5ML ~~LOC~~ SOAJ
2.5000 mg | SUBCUTANEOUS | 2 refills | Status: AC
Start: 2023-04-30 — End: ?

## 2023-04-30 NOTE — Telephone Encounter (Signed)
 Insurance will not cover zepbound- even for sleep apnea- sorry- no alternative

## 2023-04-30 NOTE — Telephone Encounter (Signed)
Pt notified.    LS

## 2023-04-30 NOTE — Telephone Encounter (Signed)
 Sleep apnea was put in for the diagnosis. Zepbound is excluded from coverage.

## 2023-04-30 NOTE — Telephone Encounter (Signed)
 ZEPBOUND 2.5 MG/0.5ML Pen   Pharmacy comment: Alternative Requested:NOT COVERED UNDER INSURANCE.

## 2023-04-30 NOTE — Telephone Encounter (Signed)
 Shawn [name] can you help with this? MMM said she had talked with you about it?

## 2023-04-30 NOTE — Telephone Encounter (Signed)
 Pharmacy Patient Advocate Encounter   Received notification from Pt Calls Messages that prior authorization for Zepbound 2.5MG /0.5ML pen-injectors is required/requested.   Insurance verification completed.   The patient is insured through [employer] .   Per test claim: PA required and submitted KEY/EOC/Request #: [id number] CANCELLED due to: PLAN EXCLUSION

## 2023-04-30 NOTE — Telephone Encounter (Signed)
 Copied from CRM [id number]. Topic: General - Other >> Apr 30, 2023 12:53 PM Emylou G wrote: Reason for CRM: Patient called adv called his insurance.. they said the next steps: Zepbound and wegovy  is not covered BUT they will cover mounjaro?  Can it be sent in that way to get approved?  Patient number on file is good.

## 2023-04-30 NOTE — Telephone Encounter (Signed)
 PA request has been Submitted. New Encounter has been or will be created for follow up. For additional info see Pharmacy Prior Auth telephone encounter from 04/30/23.

## 2023-05-04 ENCOUNTER — Telehealth: Payer: Self-pay

## 2023-05-04 NOTE — Telephone Encounter (Signed)
 Mounjaro is approved exclusively as an adjunct to diet and exercise to improve glycemic control in adults with type 2 diabetes mellitus. A review of patient's medical chart reveals no documented diagnosis of type 2 diabetes or an A1C indicative of diabetes. Therefore, they do not currently meet the criteria for prior authorization of this medication.   Key: [id number]

## 2023-05-05 NOTE — Telephone Encounter (Signed)
 Pt is not diabetic, looks like Zepbound is not covered by pt's plan. Separate encounter has been routed to [facility]. Signing off on duplicate encounter.

## 2023-05-05 NOTE — Telephone Encounter (Signed)
 He will not be able to get Mounjaro since he does not have T2DM. His insurance must be telling him they cover Mounjaro, but they mislead the patient because the patient must have T2DM to get Mounjaro. Zepbound is the only one approved for OSA, but it is not covered by his insurance. Just FYI! This can be confusing for all!

## 2023-05-06 ENCOUNTER — Telehealth: Payer: Self-pay

## 2023-05-06 NOTE — Telephone Encounter (Signed)
 Please let patient know what Shawn [name] said

## 2023-05-06 NOTE — Telephone Encounter (Signed)
 Copied from CRM [id number]. Topic: Clinical - Prescription Issue >> May 06, 2023 10:50 AM Tisa Forester wrote: Reason for CRM: been waiting over a week , checking on status of getting the [facility]  [facility]  please contact :  [phone]  patient to let know the status of getting the [facility]   [id number] [place] [place] - [address] [place] [address] Phone: [phone] Fax: [phone] Hours: Not open 24 hours

## 2023-05-07 NOTE — Telephone Encounter (Signed)
 Called unable to lm on vm 5/2 LS

## 2023-05-12 ENCOUNTER — Telehealth: Payer: Self-pay

## 2023-05-12 NOTE — Telephone Encounter (Signed)
 Copied from CRM [id number]. Topic: Clinical - Prescription Issue >> May 12, 2023  7:58 AM Shawn [name] B wrote: Reason for CRM: Patient has call in regard to the medication Mounjaro, he stated the insurance is now denying him, his cardiologist advised him that due to sleep apnea which can cause strokes or heartattacks he would be a great candidate for the Mounjaro. He stated the insurance company told him that he would be approved upon the diagnostic code being corrected for the prescription. Please call patient at [phone] he states he needs his medication and wants to know what's going on at this time. I read all the notes to him and he requested further explanation.

## 2023-05-13 ENCOUNTER — Telehealth: Payer: Self-pay

## 2023-05-13 NOTE — Telephone Encounter (Signed)
 Copied from CRM [id number]. Topic: Clinical - Medication Prior Auth >> May 13, 2023 10:22 AM Shawn [name] wrote: Reason for CRM: tirzepatide (MOUNJARO) 2.5 MG/0.5ML Pen [[id number]]  Shawn [name] from [place] requesting Prior Auth for medication. Needs call back for Verbal. Instructed office still needs to send over Clinical and visit notes as well over.  [phone] Needs this within 3 bus days

## 2023-05-13 NOTE — Telephone Encounter (Signed)
 Copied from CRM [id number]. Topic: Clinical - Prescription Issue >> May 13, 2023  9:52 AM Shawn [name] B wrote: Reason for CRM: Patient has called back in regard to the [facility] I was able to read the message from previous calls to advise him the insurance has misled him in stating they will pay for it, but only if he has been diagnosed with T2D, he stated he was trying to prevent it, I advised the previous message shows he would qualify for the zepbound which isn't covered the patient became very upset and stated he guess once he dies they'll prescribe and approve the medication. I advised the patient that I was very sorry for the confusion, but he should speak with his insurance company about the mix up.

## 2023-05-13 NOTE — Telephone Encounter (Signed)
 Spoke with patient that we are trying again to get mounjario approved

## 2023-05-14 NOTE — Telephone Encounter (Signed)
 PA request has been Received. New Encounter has been or will be created for follow up. For additional info see Pharmacy Prior Auth telephone encounter from 05/04/2023.

## 2023-05-17 ENCOUNTER — Telehealth: Payer: Self-pay

## 2023-05-17 ENCOUNTER — Other Ambulatory Visit (HOSPITAL_COMMUNITY): Payer: Self-pay

## 2023-05-17 NOTE — Telephone Encounter (Signed)
 Insurance companies are becoming increasingly stricter about requiring thorough documentation of lifestyle modifications in the patient's chart at each visit. This includes detailed records of diet recommendations (caloric intake, etc), exercise plans (amount of time/wk, etc), and an emphasis on the patient's commitment to continuing these efforts while on medication.  Without this additional documentation in the chart notes, a prior authorization will most likely be denied. Patient has a high A1C. Will not be approved for Mounjaro without T2D diagnosis. Please advise

## 2023-05-18 ENCOUNTER — Telehealth: Payer: Self-pay

## 2023-05-18 NOTE — Telephone Encounter (Signed)
 Copied from CRM [id number]. Topic: Clinical - Prescription Issue >> May 18, 2023 10:00 AM Julie Oddi wrote: Reason for CRM: Patient called in to provide information for prior auth approval for medication. Please contact [phone]. Please contact for P2P for patients medication.

## 2023-05-18 NOTE — Telephone Encounter (Signed)
 Please let patient know:  I personally placed a call to [phone] and spoke with [place] insurance representative at 2:30pm and he stated verbatim "Mounjaro is only covered if patient has T2DM"  Will not submit prior authorization as he does not meet requirements  Representative also stated if I did submit a prior auth for Mounjaro, it would be denied bc patient does not have T2DM.

## 2023-05-18 NOTE — Telephone Encounter (Signed)
 Patient aware and verbalizes understanding.

## 2023-05-18 NOTE — Telephone Encounter (Signed)
 Refer to phone note from 5/13

## 2023-09-17 ENCOUNTER — Ambulatory Visit: Admitting: Nurse Practitioner
